# Patient Record
Sex: Female | Born: 1940 | Race: White | Hispanic: No | Marital: Married | Smoking: Former smoker
Health system: Southern US, Community
[De-identification: ages and names within clinical notes are randomized; demographics above are authoritative.]

## PROBLEM LIST (undated history)

## (undated) DIAGNOSIS — C801 Malignant (primary) neoplasm, unspecified: Secondary | ICD-10-CM

## (undated) DIAGNOSIS — I10 Essential (primary) hypertension: Secondary | ICD-10-CM

## (undated) DIAGNOSIS — E7800 Pure hypercholesterolemia, unspecified: Secondary | ICD-10-CM

## (undated) HISTORY — PX: CATARACT EXTRACTION: SUR2

## (undated) HISTORY — DX: Pure hypercholesterolemia, unspecified: E78.00

## (undated) HISTORY — PX: BREAST SURGERY: SHX581

## (undated) HISTORY — DX: Essential (primary) hypertension: I10

---

## 1980-05-12 HISTORY — PX: CHOLECYSTECTOMY: SHX55

## 2016-06-09 ENCOUNTER — Ambulatory Visit (HOSPITAL_COMMUNITY): Payer: Self-pay

## 2016-06-12 ENCOUNTER — Encounter (HOSPITAL_COMMUNITY): Payer: Self-pay

## 2016-06-12 ENCOUNTER — Encounter (HOSPITAL_COMMUNITY): Payer: Medicare Other | Attending: Oncology | Admitting: Oncology

## 2016-06-12 ENCOUNTER — Encounter (HOSPITAL_COMMUNITY): Payer: Self-pay | Admitting: Lab

## 2016-06-12 ENCOUNTER — Telehealth (HOSPITAL_COMMUNITY): Payer: Self-pay | Admitting: Emergency Medicine

## 2016-06-12 ENCOUNTER — Encounter (HOSPITAL_COMMUNITY): Payer: Medicare Other

## 2016-06-12 VITALS — BP 101/73 | HR 72 | Temp 97.7°F | Resp 18 | Ht 63.0 in | Wt 191.8 lb

## 2016-06-12 DIAGNOSIS — E7800 Pure hypercholesterolemia, unspecified: Secondary | ICD-10-CM | POA: Diagnosis not present

## 2016-06-12 DIAGNOSIS — I10 Essential (primary) hypertension: Secondary | ICD-10-CM | POA: Diagnosis not present

## 2016-06-12 DIAGNOSIS — F17210 Nicotine dependence, cigarettes, uncomplicated: Secondary | ICD-10-CM | POA: Diagnosis not present

## 2016-06-12 DIAGNOSIS — Z9221 Personal history of antineoplastic chemotherapy: Secondary | ICD-10-CM | POA: Insufficient documentation

## 2016-06-12 DIAGNOSIS — D3A8 Other benign neuroendocrine tumors: Secondary | ICD-10-CM | POA: Diagnosis not present

## 2016-06-12 DIAGNOSIS — Z888 Allergy status to other drugs, medicaments and biological substances status: Secondary | ICD-10-CM | POA: Diagnosis not present

## 2016-06-12 DIAGNOSIS — R0602 Shortness of breath: Secondary | ICD-10-CM | POA: Insufficient documentation

## 2016-06-12 DIAGNOSIS — Z820 Family history of epilepsy and other diseases of the nervous system: Secondary | ICD-10-CM | POA: Diagnosis not present

## 2016-06-12 DIAGNOSIS — Z885 Allergy status to narcotic agent status: Secondary | ICD-10-CM | POA: Diagnosis not present

## 2016-06-12 DIAGNOSIS — Z9049 Acquired absence of other specified parts of digestive tract: Secondary | ICD-10-CM | POA: Insufficient documentation

## 2016-06-12 DIAGNOSIS — C3491 Malignant neoplasm of unspecified part of right bronchus or lung: Secondary | ICD-10-CM | POA: Insufficient documentation

## 2016-06-12 DIAGNOSIS — Z8249 Family history of ischemic heart disease and other diseases of the circulatory system: Secondary | ICD-10-CM | POA: Diagnosis not present

## 2016-06-12 DIAGNOSIS — Z9889 Other specified postprocedural states: Secondary | ICD-10-CM | POA: Diagnosis not present

## 2016-06-12 DIAGNOSIS — C7A8 Other malignant neuroendocrine tumors: Secondary | ICD-10-CM

## 2016-06-12 DIAGNOSIS — R51 Headache: Secondary | ICD-10-CM | POA: Insufficient documentation

## 2016-06-12 DIAGNOSIS — Z823 Family history of stroke: Secondary | ICD-10-CM | POA: Insufficient documentation

## 2016-06-12 DIAGNOSIS — I4892 Unspecified atrial flutter: Secondary | ICD-10-CM | POA: Diagnosis not present

## 2016-06-12 DIAGNOSIS — R05 Cough: Secondary | ICD-10-CM | POA: Diagnosis not present

## 2016-06-12 DIAGNOSIS — Z79899 Other long term (current) drug therapy: Secondary | ICD-10-CM | POA: Insufficient documentation

## 2016-06-12 DIAGNOSIS — J910 Malignant pleural effusion: Secondary | ICD-10-CM | POA: Insufficient documentation

## 2016-06-12 MED ORDER — PROCHLORPERAZINE MALEATE 10 MG PO TABS: 10.0000 mg | ORAL_TABLET | Freq: Four times a day (QID) | ORAL | 1 refills | Status: DC | PRN

## 2016-06-12 MED ORDER — HEPARIN SOD (PORK) LOCK FLUSH 100 UNIT/ML IV SOLN: 500.0000 [IU] | Freq: Once | INTRAVENOUS | Status: DC

## 2016-06-12 MED ORDER — ONDANSETRON HCL 8 MG PO TABS: 8.0000 mg | ORAL_TABLET | Freq: Two times a day (BID) | ORAL | 1 refills | Status: AC | PRN

## 2016-06-12 MED ORDER — LIDOCAINE-PRILOCAINE 2.5-2.5 % EX CREA: TOPICAL_CREAM | CUTANEOUS | 3 refills | Status: DC

## 2016-06-12 MED ORDER — LIDOCAINE-PRILOCAINE 2.5-2.5 % EX CREA
TOPICAL_CREAM | CUTANEOUS | 3 refills | Status: AC
Start: 2016-06-12 — End: ?

## 2016-06-12 MED ORDER — PROCHLORPERAZINE MALEATE 10 MG PO TABS: 10.0000 mg | ORAL_TABLET | Freq: Four times a day (QID) | ORAL | 1 refills | Status: AC | PRN

## 2016-06-12 MED ORDER — ONDANSETRON HCL 8 MG PO TABS: 8.0000 mg | ORAL_TABLET | Freq: Two times a day (BID) | ORAL | 1 refills | Status: DC | PRN

## 2016-06-12 MED ORDER — SODIUM CHLORIDE 0.9% FLUSH: 10.0000 mL | INTRAVENOUS | Status: DC | PRN

## 2016-06-12 NOTE — Progress Notes (Unsigned)
Referral sent to Dr Arnoldo Morale for port.  appt 2/20. Patient aware

## 2016-06-12 NOTE — Patient Instructions (Signed)
[place] at [facility] Discharge Instructions  RECOMMENDATIONS MADE BY THE CONSULTANT AND ANY TEST RESULTS WILL BE SENT TO YOUR REFERRING PHYSICIAN.  You were seen today by Dr. Barron Schmid We will refer you to Dr. Arnoldo Morale for port-a-cath placment Follow up in 3-4 weeks in our clinic  Thank you for choosing [place] at [facility] to provide your oncology and hematology care.  To afford each patient quality time with our provider, please arrive at least 15 minutes before your scheduled appointment time.    If you have a lab appointment with the [facility] please come in thru the  Main Entrance and check in at the main information desk  You need to re-schedule your appointment should you arrive 10 or more minutes late.  We strive to give you quality time with our providers, and arriving late affects you and other patients whose appointments are after yours.  Also, if you no show three or more times for appointments you may be dismissed from the clinic at the providers discretion.     Again, thank you for choosing [facility].  Our hope is that these requests will decrease the amount of time that you wait before being seen by our physicians.       _____________________________________________________________  Should you have questions after your visit to [facility], please contact our office at [phone] between the hours of 8:30 a.m. and 4:30 p.m.  Voicemails left after 4:30 p.m. will not be returned until the following business day.  For prescription refill requests, have your pharmacy contact our office.       Resources For Cancer Patients and their Caregivers ? American Cancer Society: Can assist with transportation, wigs, general needs, runs Look Good Feel Better.        718 083 2766 ? Cancer Care: Provides financial assistance, online support groups, medication/co-pay assistance.  1-800-813-HOPE  (579)570-5075) ? [place] Assists [place] cancer patients and their families through emotional , educational and financial support.  431-781-7808 ? [place] DSS Where to apply for food stamps, Medicaid and utility assistance. 514-345-3227 ? [employer]: Transportation to medical appointments. 323-275-5941 ? Social Security Administration: May apply for disability if have a Stage IV cancer. (239)752-1637 984-096-9368 ? [employer], Disability and Transit Services: Assists with nutrition, care and transit needs. [place] Support Programs: '@10RELATIVEDAYS'$ @ > Cancer Support Group  2nd Tuesday of the month 1pm-2pm, Journey Room  > Creative Journey  3rd Tuesday of the month 1130am-1pm, Journey Room  > Look Good Feel Better  1st Wednesday of the month 10am-12 noon, Journey Room (Call [place] to register [phone])

## 2016-06-12 NOTE — Progress Notes (Signed)
[place]  CONSULT NOTE  No care team member to display  CHIEF COMPLAINTS/PURPOSE OF CONSULTATION:    No history exists.    HISTORY OF PRESENTING ILLNESS:  Robin Hernandez 76 y.o. female is here because of referral by Robin Slimmer, MD for small-cell lung cancer for chemotherapy.  As per Dr. Hali Marry, MD note on 06/09/2016: " Robin Hernandez is a pleasant 76 year old female with past medical history of hypertension, hyper lipidemia, osteoporosis presented initially with complaints of generalized fatigue came appropriately worse in the past month. In the ED she was found to have elevated pulse 154 which was subsequently found to be atrial flutter. The chest x-ray was found to have a pleural effusion and upon further investigation with CT add large pleural effusion on the right was found. A thoracentesis was performed during hospital stay which removed 1.8 mL of bloody fluid. 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 A mass was also identified upon CT scan and heme-oncology was consulted and I saw the patient while she was in the hospital. She underwent CT abd/pelvis which did not reveal metastasis and Aaron Edelman MRI was also negative for metastasis. She came to outpatient clinic on Monday for day 1 carboplatin AUC 5 and etoposide 100 mg/m2 day 1. Due to national shortage, she has no been able to receive etoposide day 2 and day 3."    Patient presents today accompanied by her husband. She is here for temporary treatment as Dr. Hali Marry clinic currently has a shortage of her chemotherapy medication.  States her breathing is fine, and states doesn't really need the [employer] Plus oxygen unit but her husband wants her to have it.  States she vomited minimally after treatment. Overall tolerated her chemotherapy very well.  She states she is tired, but  she is still active.    MEDICAL HISTORY:  Past Medical History:    Diagnosis Date  . Hypercholesterolemia   . Hypertension     SURGICAL HISTORY: Past Surgical History:  Procedure Laterality Date  . CATARACT EXTRACTION Bilateral    2011-2012  . CHOLECYSTECTOMY  1982    SOCIAL HISTORY: Social History   Social History  . Marital status: Married    Spouse name: N/A  . Number of children: N/A  . Years of education: N/A   Occupational History  . Not on file.   Social History Main Topics  . Smoking status: Former Smoker    Packs/day: 0.75    Years: 55.00    Types: Cigarettes    Quit date: 05/25/2016  . Smokeless tobacco: Former Systems developer  . Alcohol use No  . Drug use: No  . Sexual activity: Not Currently   Other Topics Concern  . Not on file   Social History Narrative  . No narrative on file   Social Hx: Current everyday smoker. Been smoking for 40 years, 1 pack per day.  Patient is married. Patient lives with spouse. Denies alcohol use. Denies drug use. Patient is retired. Has at least 3 grandchildren and 1 son.   FAMILY HISTORY: Family History  Problem Relation Age of Onset  . Parkinson's disease Mother   . Heart attack Mother   . Breast cancer Sister   . Cancer Brother   . Cancer Maternal Aunt   . Cancer Maternal Uncle   . Stroke Paternal Aunt   . Stroke Paternal Uncle   . Heart attack Paternal Uncle   . CAD  Maternal Grandmother   . Heart attack Maternal Grandmother     ALLERGIES:  has no allergies on file.  MEDICATIONS:  Current Outpatient Prescriptions  Medication Sig Dispense Refill  . diclofenac (VOLTAREN) 75 MG EC tablet Take 75 mg by mouth 2 (two) times daily.    . metoprolol tartrate (LOPRESSOR) 25 MG tablet Take 25 mg by mouth daily.    . prochlorperazine (COMPAZINE) 5 MG tablet Take 5 mg by mouth every 6 (six) hours as needed for nausea or vomiting.    . simvastatin (ZOCOR) 40 MG tablet Take 40 mg by mouth daily.     No current facility-administered medications for this visit.    Facility-Administered  Medications Ordered in Other Visits  Medication Dose Route Frequency Provider Last Rate Last Dose  . heparin lock flush 100 unit/mL  500 Units Intravenous Once Twana First, MD      . sodium chloride flush (NS) 0.9 % injection 10 mL  10 mL Intravenous PRN Twana First, MD        Review of Systems  Constitutional: Positive for malaise/fatigue.  HENT: Negative.   Eyes: Negative.   Respiratory: Negative.   Cardiovascular: Negative.   Gastrointestinal: Positive for abdominal pain (right upper abdomen) and vomiting (minimally after first treatment).  Genitourinary: Negative.   Musculoskeletal: Negative.   Skin: Negative.   Neurological: Negative.   Endo/Heme/Allergies: Negative.   Psychiatric/Behavioral: Negative.   All other systems reviewed and are negative.  14 point ROS was done and is otherwise as detailed above or in HPI   PHYSICAL EXAMINATION: ECOG PERFORMANCE STATUS: 1 - Symptomatic but completely ambulatory  Vitals:   06/12/16 0922  BP: 101/73  Pulse: 72  Resp: 18  Temp: 97.7 F (36.5 C)   Filed Weights   06/12/16 0922  Weight: 191 lb 12.8 oz (87 kg)     Physical Exam  Constitutional: She is oriented to person, place, and time and well-developed, well-nourished, and in no distress.  She was able to get on the physical exam table with help.   HENT:  Head: Normocephalic and atraumatic.  Eyes: Conjunctivae and EOM are normal. Pupils are equal, round, and reactive to light.  Neck: Normal range of motion. Neck supple.  Cardiovascular: Normal rate, regular rhythm and normal heart sounds.   Pulmonary/Chest: Effort normal.  Decreased breath sounds in the right lower lobe of the lung  Abdominal: Soft. Bowel sounds are normal.  Musculoskeletal: Normal range of motion.  Neurological: She is alert and oriented to person, place, and time. Gait normal.  Skin: Skin is warm and dry.  Nursing note and vitals reviewed.    LABORATORY DATA:  I have reviewed the data as  listed No results found for: WBC, HGB, HCT, MCV, PLT CMP  No results found for: NA, K, CL, CO2, GLUCOSE, BUN, CREATININE, CALCIUM, PROT, ALBUMIN, AST, ALT, ALKPHOS, BILITOT, GFRNONAA, GFRAA   RADIOGRAPHIC STUDIES: I have personally reviewed the radiological images as listed and agreed with the findings in the report. No results found.   Study Result  Thoracentesis surgical pleural eff 06/02/16  Impression: Status post ultrasound guided right thoracentesis.     ASSESSMENT & PLAN:  Cancer Staging No matching staging information was found for the patient. No problem-specific Assessment & Plan notes found for this encounter. 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  As per Dr. Hali Marry, MD plan on 06/09/16: "1. Small cell neuroendocrine tumor of lung. The patient's pleural fluid showed small  cell neuroendocrine tumor of the lung with high Ki67. MRI of the brain was negative and CT abdomen was negative. 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 The son wanted to know the type of tumor, treatment, prognosis. I informed them that lung cancer is highly aggressive, most common cancer causing mortality, of 2 types small cell and non-small cell. Of these 2, small cell is more aggressive. Prognosis is about 12 months. The prognosis is with treatment. Our usual treatment status gene walls giving combination of carboplatin and etoposide every 21 days for 4-6 cycles and follow the patient with surveillance scans. The patient and the family verbalized understanding. She tolerated day 1 of cycle 1 pretty well. We have not been able to give day 2 and day 3 because the top side is National a short and we have not received it through the pharmacy." I have discussed patient's case over the phone with Dr. Junius Roads last week. She will be with [place] temporarily until Dr. Junius Roads receives the  chemotherapy medicine in stock.   Patient will complete cycle 1 day 2 and 3 of etopisode Feb 5 and 6, 2018. He is planning for a total of 6 cycles of carbo/etoposide.  Surgery consult for chemo port placement.  RTC on 07/07/16 for follow up.    ORDERS PLACED FOR THIS ENCOUNTER: No orders of the defined types were placed in this encounter.   MEDICATIONS PRESCRIBED THIS ENCOUNTER: Meds ordered this encounter  Medications  . simvastatin (ZOCOR) 40 MG tablet    Sig: Take 40 mg by mouth daily.  . metoprolol tartrate (LOPRESSOR) 25 MG tablet    Sig: Take 25 mg by mouth daily.  . diclofenac (VOLTAREN) 75 MG EC tablet    Sig: Take 75 mg by mouth 2 (two) times daily.  . prochlorperazine (COMPAZINE) 5 MG tablet    Sig: Take 5 mg by mouth every 6 (six) hours as needed for nausea or vomiting.     This document serves as a record of services personally performed by Twana First, MD. It was created on her behalf by Shirlean Mylar, a trained medical scribe. The creation of this record is based on the scribe's personal observations and the provider's statements to them. This document has been checked and approved by the attending provider.  I have reviewed the above documentation for accuracy and completeness and I agree with the above.  This note was electronically signed.    [employer]  06/12/2016 10:08 AM

## 2016-06-12 NOTE — Telephone Encounter (Signed)
Called pt to introduce myself as the navigator and explained a little bit about what I do.  Let the pt know that I was going to call in 2 different nausea medication. Pharamcy verified.  She could use the zofran first if it did not work she could add the compazine.  Also called in emla cream for when she receives the port.  She will have her consultation with Dr Arnoldo Morale 07/01/2016 for a port-a-cath.

## 2016-06-12 NOTE — Progress Notes (Signed)
Patient had a regular peripheral IV, not a PICC line. This was removed, see documentation in flowsheet.

## 2016-06-16 ENCOUNTER — Other Ambulatory Visit (HOSPITAL_COMMUNITY): Payer: Self-pay | Admitting: Oncology

## 2016-06-16 ENCOUNTER — Ambulatory Visit (HOSPITAL_COMMUNITY)
Admission: RE | Admit: 2016-06-16 | Discharge: 2016-06-16 | Disposition: A | Discharge status: Home / Self Care | Payer: Medicare Other | Source: Ambulatory Visit | Attending: Oncology | Admitting: Oncology

## 2016-06-16 ENCOUNTER — Ambulatory Visit (HOSPITAL_COMMUNITY)
Admission: RE | Admit: 2016-06-16 | Discharge: 2016-06-16 | Disposition: A | Discharge status: Home / Self Care | Payer: Medicare Other | Source: Ambulatory Visit | Attending: Diagnostic Radiology | Admitting: Diagnostic Radiology

## 2016-06-16 ENCOUNTER — Encounter (HOSPITAL_COMMUNITY): Payer: Self-pay

## 2016-06-16 ENCOUNTER — Encounter (HOSPITAL_COMMUNITY): Payer: Medicare Other

## 2016-06-16 VITALS — BP 117/52 | HR 78 | Temp 97.3°F | Resp 18 | Wt 186.8 lb

## 2016-06-16 DIAGNOSIS — C3491 Malignant neoplasm of unspecified part of right bronchus or lung: Secondary | ICD-10-CM

## 2016-06-16 DIAGNOSIS — J90 Pleural effusion, not elsewhere classified: Secondary | ICD-10-CM | POA: Diagnosis not present

## 2016-06-16 LAB — COMPREHENSIVE METABOLIC PANEL
ALBUMIN: 3.4 g/dL — AB (ref 3.5–5.0)
ALK PHOS: 68 U/L (ref 38–126)
ALT: 21 U/L (ref 14–54)
ANION GAP: 8 (ref 5–15)
AST: 22 U/L (ref 15–41)
BILIRUBIN TOTAL: 1.5 mg/dL — AB (ref 0.3–1.2)
BUN: 16 mg/dL (ref 6–20)
CALCIUM: 8.7 mg/dL — AB (ref 8.9–10.3)
CO2: 27 mmol/L (ref 22–32)
Chloride: 105 mmol/L (ref 101–111)
Creatinine, Ser: 0.92 mg/dL (ref 0.44–1.00)
GFR calc Af Amer: >60 mL/min (ref >60)
GFR calc non Af Amer: >60 mL/min (ref >60)
GLUCOSE: 100 mg/dL — AB (ref 65–99)
Potassium: 3.7 mmol/L (ref 3.5–5.1)
Sodium: 140 mmol/L (ref 135–145)
TOTAL PROTEIN: 6.6 g/dL (ref 6.5–8.1)

## 2016-06-16 LAB — CBC WITH DIFFERENTIAL/PLATELET
BASOS PCT: 0 %
Basophils Absolute: 0 10*3/uL (ref 0.0–0.1)
EOS ABS: 0.1 10*3/uL (ref 0.0–0.7)
EOS PCT: 6 %
HEMATOCRIT: 33.4 % — AB (ref 36.0–46.0)
HEMOGLOBIN: 11.2 g/dL — AB (ref 12.0–15.0)
LYMPHS PCT: 78 %
Lymphs Abs: 1.2 10*3/uL (ref 0.7–4.0)
MCH: 32.3 pg (ref 26.0–34.0)
MCHC: 33.5 g/dL (ref 30.0–36.0)
MCV: 96.3 fL (ref 78.0–100.0)
Monocytes Absolute: 0.1 10*3/uL (ref 0.1–1.0)
Monocytes Relative: 7 %
NEUTROS PCT: 9 %
Neutro Abs: 0.1 10*3/uL — ABNORMAL LOW (ref 1.7–7.7)
Platelets: 34 10*3/uL — ABNORMAL LOW (ref 150–400)
RBC: 3.47 MIL/uL — ABNORMAL LOW (ref 3.87–5.11)
RDW: 12.9 % (ref 11.5–15.5)
WBC: 1.5 10*3/uL — ABNORMAL LOW (ref 4.0–10.5)

## 2016-06-16 NOTE — Progress Notes (Signed)
Thoracentesis complete no signs of distress. 1.3 L pleural fluid removed.

## 2016-06-16 NOTE — Patient Instructions (Signed)
No chemo treatment today, all of your blood counts are to low to give you chemotherapy. Call us with any questions or concerns. Anderson Malta (Nurse Navigator) [phone]  Appointment with Dr.Mark Arnoldo Morale (surgeon) for port-a-cath placement/procedure.   Neutropenia Introduction Neutropenia is a condition that occurs when you have a lower-than-normal level of a type of white blood cell (neutrophil) in your body. Neutrophils are made in the spongy center of large bones (bone marrow) and they fight infections. Neutrophils are your body's main defense against bacterial and fungal infections. The fewer neutrophils you have and the longer your body remains without them, the greater your risk of getting a severe infection. What are the causes? This condition can occur if your body uses up or destroys neutrophils faster than your bone marrow can make them. This problem may happen because of:  Bacterial or fungal infection.  Allergic disorders.  Reactions to some medicines.  Autoimmune disease.  An enlarged spleen. This condition can also occur if your bone marrow does not produce enough neutrophils. This problem may be caused by:  Cancer.  Cancer treatments, such as radiation or chemotherapy.  Viral infections.  Medicines, such as phenytoin.  Vitamin B12 deficiency.  Diseases of the bone marrow.  Environmental toxins, such as insecticides.  Follow these instructions at home: Medicines  Take over-the-counter and prescription medicines only as told by your health care provider.  Get a seasonal flu shot (influenza vaccine). Lifestyle  Do not eat unpasteurized foods.Do not eat unwashed raw fruits or vegetables.  Avoid exposure to groups of people or children.  Avoid being around people who are sick.  Avoid being around dirt or dust, such as in construction areas or gardens.  Do not provide direct care for pets. Avoid animal droppings. Do not clean litter boxes and bird  cages. Hygiene   Bathe daily.  Clean the area between the genitals and the anus (perineal area) after you urinate or have a bowel movement. If you are female, wipe from front to back.  Brush your teeth with a soft toothbrush before and after meals.  Do not use a razor that has a blade. Use an electric razor to remove hair.  Wash your hands often. Make sure others who come in contact with you also wash their hands. If soap and water are not available, use hand sanitizer. General instructions  Do not have sex unless your health care provider has approved.  Take actions to avoid cuts and burns. For example:  Be cautious when you use knives. Always cut away from yourself.  Keep knives in protective sheaths or guards when not in use.  Use oven mitts when you cook with a hot stove, oven, or grill.  Stand a safe distance away from open fires.  Avoid people who received a vaccine in the past 30 days if that vaccine contained a live version of the germ (live vaccine). You should not get a live vaccine. Common live vaccines are varicella, measles, mumps, and rubella.  Do not share food utensils.  Do not use tampons, enemas, or rectal suppositories unless your health care provider has approved.  Keep all appointments as told by your health care provider. This is important. Contact a health care provider if:  You have a fever.  You have chills or you start to shake.  You have:  A sore throat.  A warm, red, or tender area on your skin.  A cough.  Frequent or painful urination.  Vaginal discharge or itching.  You  develop:  Sores in your mouth or anus.  Swollen lymph nodes.  Red streaks on the skin.  A rash.  You feel:  Nauseous or you vomit.  Very fatigued.  Short of breath. This information is not intended to replace advice given to you by your health care provider. Make sure you discuss any questions you have with your health care provider. Document Released:  10/18/2001 Document Revised: 10/04/2015 Document Reviewed: 11/08/2014  2017 Elsevier

## 2016-06-16 NOTE — Progress Notes (Signed)
No chemo today. Platelet count and ANC too low. Neutropenic precautions/education provided to patient and husband. Verbalized understanding. Stable and ambulatory when leaving clinic.

## 2016-06-16 NOTE — Procedures (Signed)
PreOperative Dx: SCCA RT lung, RIGHT pleural effusion Postoperative Dx: SCCA RT lung, RIGHT pleural effusion Procedure:   US guided RIGHT thoracentesis Radiologist:  Thornton Papas Anesthesia:  10 ml of 1% lidocaine Specimen:  1.3 L of serosanguinous colored fluid EBL:   < 1 ml Complications: None

## 2016-06-17 ENCOUNTER — Ambulatory Visit (HOSPITAL_COMMUNITY): Payer: Self-pay

## 2016-06-18 ENCOUNTER — Ambulatory Visit (HOSPITAL_COMMUNITY): Payer: No Typology Code available for payment source

## 2016-06-23 ENCOUNTER — Ambulatory Visit (HOSPITAL_COMMUNITY): Payer: Self-pay

## 2016-06-23 ENCOUNTER — Ambulatory Visit (HOSPITAL_COMMUNITY): Payer: Self-pay | Admitting: Oncology

## 2016-06-24 ENCOUNTER — Ambulatory Visit (HOSPITAL_COMMUNITY): Payer: Self-pay

## 2016-06-25 ENCOUNTER — Ambulatory Visit (HOSPITAL_COMMUNITY): Payer: Self-pay

## 2016-06-30 ENCOUNTER — Telehealth (HOSPITAL_COMMUNITY): Payer: Self-pay

## 2016-06-30 ENCOUNTER — Ambulatory Visit (HOSPITAL_COMMUNITY)
Admission: RE | Admit: 2016-06-30 | Discharge: 2016-06-30 | Disposition: A | Discharge status: Home / Self Care | Payer: Medicare Other | Source: Ambulatory Visit | Attending: Oncology | Admitting: Oncology

## 2016-06-30 ENCOUNTER — Ambulatory Visit (HOSPITAL_COMMUNITY)
Admission: RE | Admit: 2016-06-30 | Discharge: 2016-06-30 | Disposition: A | Discharge status: Home / Self Care | Payer: Medicare Other | Source: Ambulatory Visit | Attending: Diagnostic Radiology | Admitting: Diagnostic Radiology

## 2016-06-30 ENCOUNTER — Encounter (HOSPITAL_COMMUNITY): Payer: Self-pay

## 2016-06-30 ENCOUNTER — Other Ambulatory Visit (HOSPITAL_COMMUNITY): Payer: Self-pay | Admitting: Oncology

## 2016-06-30 DIAGNOSIS — Z9889 Other specified postprocedural states: Secondary | ICD-10-CM | POA: Diagnosis not present

## 2016-06-30 DIAGNOSIS — J90 Pleural effusion, not elsewhere classified: Secondary | ICD-10-CM | POA: Insufficient documentation

## 2016-06-30 DIAGNOSIS — C3491 Malignant neoplasm of unspecified part of right bronchus or lung: Secondary | ICD-10-CM

## 2016-06-30 NOTE — Progress Notes (Signed)
Paracentesis complete no signs of distress. 1.5L serosanguinous fluid removed.

## 2016-06-30 NOTE — Telephone Encounter (Signed)
Patient called stating her husband thinks she needs a thoracentesis. She states her husband is more worried than she is. She states she is having more difficulty breathing this week than last week. She can be reached at [phone].

## 2016-06-30 NOTE — Procedures (Signed)
PreOperative Dx: SCC RT lung, recurrent RT pleural effusion Postoperative Dx: SCC RT lung, recurrent RT pleural effusion Procedure:   US guided RT thoracentesis Radiologist:  Thornton Papas Anesthesia:  13 ml of 1% lidocaine Specimen:  1.5 L of serosanguinous colored fluid EBL:   < 1 ml Complications: None

## 2016-07-01 NOTE — H&P (Addendum)
   Vital Signs:  Vitals as of: 2/82/4175: Systolic 301: Diastolic 63: Heart Rate 040: Temp 96.69F (Temporal): Height 8f 3in: Weight 187Lbs 0 Ounces: BMI 33.13   BMI : 33.13 kg/m2  Subjective: This 76year old female presents for of small cell lung carcinoma.  Referred by Oncology for portacath insertion.  Recently had right thoracentesis.  Has already started chemotherapy.  Review of Symptoms:  Constitutional:fatigue Head:negative Eyes:negative Nose/Mouth/Throat:negative Cardiovascular:negative Respiratory:dyspnea Gastrointestinnegative Genitourinary:frequency joint pain Skin:negative Hematolgic/Lymphatic:negative Allergic/Immunologic:negative   Past Medical History:Reviewed  Past Medical History  Surgical History: cholecystectomy, cataract surgery Medical Problems: small cell lung carcinoma, high cholesterol, tachycardia Allergies: prednisone, codeine, streptomycin Medications: lipitor, diclofenac, metoprolol   Social History:Reviewed  Social History  Preferred Language: English Race:  White Ethnicity: Not Hispanic / Latino Age: [age]year Marital Status:  M Alcohol: no   Smoking Status: Never smoker reviewed on 07/01/2016 Functional Status reviewed on 07/01/2016 ------------------------------------------------ Bathing: Normal Cooking: Normal Dressing: Normal Driving: Normal Eating: Normal Managing Meds: Normal Oral Care: Normal Shopping: Normal Toileting: Normal Transferring: Normal Walking: Normal Cognitive Status reviewed on 07/01/2016 ------------------------------------------------ Attention: Normal Decision Making: Normal Language: Normal Memory: Normal Motor: Normal Perception: Normal Problem Solving: Normal Visual and Spatial: Normal   Family History:Reviewed  Family Health History Mother, Deceased; Parkinson's disease;  Father, Deceased; Healthy;     Objective Information: General:Well appearing, well nourished  in no distress. Head:Atraumatic; no masses; no abnormalities Neck:Supple without lymphadenopathy.  Heart:RRR, no murmur or gallop.  Normal S1, S2.  No S3, S4.  Lungs:CTA bilaterally, no wheezes, rhonchi, rales.  Breathing unlabored. Oncology notes reviewed. Assessment:Small cell lung carcinoma, need for portacath insertion  Diagnoses: 162.9  C34.90 Small cell carcinoma of lung (Malignant neoplasm of unspecified part of unspecified bronchus or lung)  Procedures: [id number]- OFFICE OUTPATIENT NEW 20 MINUTES    Plan:  Scheduled for portacath insertion on 07/04/16.   Patient Education:Alternative treatments to surgery were discussed with patient (and family).Risks and benefits  of procedure including bleeding, infection, and pneumothorax were fully explained to the patient (and family) who gave informed consent. Patient/family questions were addressed.  Follow-up:Pending Surgery

## 2016-07-01 NOTE — Patient Instructions (Signed)
Melrose Kearse  07/01/2016     '@PREFPERIOPPHARMACY'$ @   Your procedure is scheduled on  07/04/2016   Report to [facility] at  900  A.M.  Call this number if you have problems the morning of surgery:  [phone]   Remember:  Do not eat food or drink liquids after midnight.  Take these medicines the morning of surgery with A SIP OF WATER  Metoprolol, zofran or compazine. Take your inhaler before you come.   Do not wear jewelry, make-up or nail polish.  Do not wear lotions, powders, or perfumes, or deoderant.  Do not shave 48 hours prior to surgery.  Men may shave face and neck.  Do not bring valuables to the hospital.  [facility] is not responsible for any belongings or valuables.  Contacts, dentures or bridgework may not be worn into surgery.  Leave your suitcase in the car.  After surgery it may be brought to your room.  For patients admitted to the hospital, discharge time will be determined by your treatment team.  Patients discharged the day of surgery will not be allowed to drive home.   Name and phone number of your driver:   family Special instructions:  None  Please read over the following fact sheets that you were given. Anesthesia Post-op Instructions and Care and Recovery After Surgery       Implanted [facility] Guide An implanted port is a type of central line that is placed under the skin. Central lines are used to provide IV access when treatment or nutrition needs to be given through a person's veins. Implanted ports are used for long-term IV access. An implanted port may be placed because:   You need IV medicine that would be irritating to the small veins in your hands or arms.   You need long-term IV medicines, such as antibiotics.   You need IV nutrition for a long period.   You need frequent blood draws for lab tests.   You need dialysis.  Implanted ports are usually placed in the chest area, but they can also be placed in the  upper arm, the abdomen, or the leg. An implanted port has two main parts:   Reservoir. The reservoir is round and will appear as a small, raised area under your skin. The reservoir is the part where a needle is inserted to give medicines or draw blood.   Catheter. The catheter is a thin, flexible tube that extends from the reservoir. The catheter is placed into a large vein. Medicine that is inserted into the reservoir goes into the catheter and then into the vein.  HOW WILL I CARE FOR MY INCISION SITE? Do not get the incision site wet. Bathe or shower as directed by your health care provider.  HOW IS MY PORT ACCESSED? Special steps must be taken to access the port:   Before the port is accessed, a numbing cream can be placed on the skin. This helps numb the skin over the port site.   Your health care provider uses a sterile technique to access the port.  Your health care provider must put on a mask and sterile gloves.  The skin over your port is cleaned carefully with an antiseptic and allowed to dry.  The port is gently pinched between sterile gloves, and a needle is inserted into the port.  Only "non-coring" port needles should be used to access the port. Once  the port is accessed, a blood return should be checked. This helps ensure that the port is in the vein and is not clogged.   If your port needs to remain accessed for a constant infusion, a clear (transparent) bandage will be placed over the needle site. The bandage and needle will need to be changed every week, or as directed by your health care provider.   Keep the bandage covering the needle clean and dry. Do not get it wet. Follow your health care provider's instructions on how to take a shower or bath while the port is accessed.   If your port does not need to stay accessed, no bandage is needed over the port.  WHAT IS FLUSHING? Flushing helps keep the port from getting clogged. Follow your health care provider's  instructions on how and when to flush the port. Ports are usually flushed with saline solution or a medicine called heparin. The need for flushing will depend on how the port is used.   If the port is used for intermittent medicines or blood draws, the port will need to be flushed:   After medicines have been given.   After blood has been drawn.   As part of routine maintenance.   If a constant infusion is running, the port may not need to be flushed.  HOW LONG WILL MY PORT STAY IMPLANTED? The port can stay in for as long as your health care provider thinks it is needed. When it is time for the port to come out, surgery will be done to remove it. The procedure is similar to the one performed when the port was put in.  WHEN SHOULD I SEEK IMMEDIATE MEDICAL CARE? When you have an implanted port, you should seek immediate medical care if:   You notice a bad smell coming from the incision site.   You have swelling, redness, or drainage at the incision site.   You have more swelling or pain at the port site or the surrounding area.   You have a fever that is not controlled with medicine. This information is not intended to replace advice given to you by your health care provider. Make sure you discuss any questions you have with your health care provider. Document Released: 04/28/2005 Document Revised: 02/16/2013 Document Reviewed: 01/03/2013 Elsevier Interactive Patient Education  2017 [employer] Insertion An implanted port is a central line that has a round shape and is placed under the skin. It is used as a long-term IV access for:   Medicines, such as chemotherapy.   Fluids.   Liquid nutrition, such as total parenteral nutrition (TPN).   Blood samples.  LET [facility] CARE PROVIDER KNOW ABOUT:  Allergies to food or medicine.   Medicines taken, including vitamins, herbs, eye drops, creams, and over-the-counter medicines.   Any allergies to  heparin.  Use of steroids (by mouth or creams).   Previous problems with anesthetics or numbing medicines.   History of bleeding problems or blood clots.   Previous surgery.   Other health problems, including diabetes and kidney problems.   Possibility of pregnancy, if this applies. RISKS AND COMPLICATIONS Generally, this is a safe procedure. However, as with any procedure, problems can occur. Possible problems include:  Damage to the blood vessel, bruising, or bleeding at the puncture site.   Infection.  Blood clot in the vessel that the port is in.  Breakdown of the skin over your port.  Very rarely a person may  develop a condition called a pneumothorax, a collection of air in the chest that may cause one of the lungs to collapse. The placement of these catheters with the appropriate imaging guidance significantly decreases the risk of a pneumothorax.  BEFORE THE PROCEDURE   Your health care provider may want you to have blood tests. These tests can help tell how well your kidneys and liver are working. They can also show how well your blood clots.   If you take blood thinners (anticoagulant medicines), ask your health care provider when you should stop taking them.   Make arrangements for someone to drive you home. This is necessary if you have been sedated for your procedure.  PROCEDURE  Port insertion usually takes about 30-45 minutes.   An IV needle will be inserted in your arm. Medicine for pain and medicine to help relax you (sedative) will flow directly into your body through this needle.   You will lie on an exam table, and you will be connected to monitors to keep track of your heart rate, blood pressure, and breathing throughout the procedure.  An oxygen monitoring device may be attached to your finger. Oxygen will be given.   Everything will be kept as germ free (sterile) as possible during the procedure. The skin near the point of the incision will  be cleansed with antiseptic, and the area will be draped with sterile towels. The skin and deeper tissues over the port area will be made numb with a local anesthetic.  Two small cuts (incisions) will be made in the skin to insert the port. One will be made in the neck to obtain access to the vein where the catheter will lie.   Because the port reservoir will be placed under the skin, a small skin incision will be made in the upper chest, and a small pocket for the port will be made under the skin. The catheter that will be connected to the port tunnels to a large central vein in the chest. A small, raised area will remain on your body at the site of the reservoir when the procedure is complete.  The port placement will be done under imaging guidance to ensure the proper placement.  The reservoir has a silicone covering that can be punctured with a special needle.   The port will be flushed with normal saline, and blood will be drawn to make sure it is working properly.  There will be nothing remaining outside the skin when the procedure is finished.   Incisions will be held together by stitches, surgical glue, or a special tape. AFTER THE PROCEDURE  You will stay in a recovery area until the anesthesia has worn off. Your blood pressure and pulse will be checked.  A final chest X-ray will be taken to check the placement of the port and to ensure that there is no injury to your lung. This information is not intended to replace advice given to you by your health care provider. Make sure you discuss any questions you have with your health care provider. Document Released: 02/16/2013 Document Revised: 05/19/2014 Document Reviewed: 02/16/2013 Elsevier Interactive Patient Education  2017 [place] Insertion, Care After Refer to this sheet in the next few weeks. These instructions provide you with information on caring for yourself after your procedure. Your health care  provider may also give you more specific instructions. Your treatment has been planned according to current medical practices, but problems sometimes occur. Call your health  care provider if you have any problems or questions after your procedure. WHAT TO EXPECT AFTER THE PROCEDURE After your procedure, it is typical to have the following:   Discomfort at the port insertion site. Ice packs to the area will help.  Bruising on the skin over the port. This will subside in 3-4 days. HOME CARE INSTRUCTIONS  After your port is placed, you will get a manufacturer's information card. The card has information about your port. Keep this card with you at all times.   Know what kind of port you have. There are many types of ports available.   Wear a medical alert bracelet in case of an emergency. This can help alert health care workers that you have a port.   The port can stay in for as long as your health care provider believes it is necessary.   A home health care nurse may give medicines and take care of the port.   You or a family member can get special training and directions for giving medicine and taking care of the port at home.  SEEK MEDICAL CARE IF:   Your port does not flush or you are unable to get a blood return.   You have a fever or chills. SEEK IMMEDIATE MEDICAL CARE IF:  You have new fluid or pus coming from your incision.   You notice a bad smell coming from your incision site.   You have swelling, pain, or more redness at the incision or port site.   You have chest pain or shortness of breath. This information is not intended to replace advice given to you by your health care provider. Make sure you discuss any questions you have with your health care provider. Document Released: 02/16/2013 Document Revised: 05/03/2013 Document Reviewed: 02/16/2013 Elsevier Interactive Patient Education  2017 [employer] PATIENT INSTRUCTIONS POST-ANESTHESIA  IMMEDIATELY  FOLLOWING SURGERY:  Do not drive or operate machinery for the first twenty four hours after surgery.  Do not make any important decisions for twenty four hours after surgery or while taking narcotic pain medications or sedatives.  If you develop intractable nausea and vomiting or a severe headache please notify your doctor immediately.  FOLLOW-UP:  Please make an appointment with your surgeon as instructed. You do not need to follow up with anesthesia unless specifically instructed to do so.  WOUND CARE INSTRUCTIONS (if applicable):  Keep a dry clean dressing on the anesthesia/puncture wound site if there is drainage.  Once the wound has quit draining you may leave it open to air.  Generally you should leave the bandage intact for twenty four hours unless there is drainage.  If the epidural site drains for more than 36-48 hours please call the anesthesia department.  QUESTIONS?:  Please feel free to call your physician or the hospital operator if you have any questions, and they will be happy to assist you.

## 2016-07-02 ENCOUNTER — Encounter (HOSPITAL_COMMUNITY): Payer: Self-pay

## 2016-07-02 ENCOUNTER — Encounter (HOSPITAL_COMMUNITY)
Admission: RE | Admit: 2016-07-02 | Discharge: 2016-07-02 | Disposition: A | Discharge status: Home / Self Care | Payer: Medicare Other | Source: Ambulatory Visit | Attending: General Surgery | Admitting: General Surgery

## 2016-07-04 ENCOUNTER — Ambulatory Visit (HOSPITAL_COMMUNITY): Payer: Medicare Other | Admitting: Anesthesiology

## 2016-07-04 ENCOUNTER — Ambulatory Visit (HOSPITAL_COMMUNITY): Payer: Medicare Other

## 2016-07-04 ENCOUNTER — Encounter (HOSPITAL_COMMUNITY)
Admission: RE | Disposition: A | Discharge status: Home / Self Care | Payer: Self-pay | Source: Ambulatory Visit | Attending: General Surgery

## 2016-07-04 ENCOUNTER — Encounter (HOSPITAL_COMMUNITY): Payer: Self-pay | Admitting: *Deleted

## 2016-07-04 ENCOUNTER — Other Ambulatory Visit: Payer: Self-pay

## 2016-07-04 ENCOUNTER — Ambulatory Visit (HOSPITAL_COMMUNITY)
Admission: RE | Admit: 2016-07-04 | Discharge: 2016-07-04 | Disposition: A | Discharge status: Home / Self Care | Payer: Medicare Other | Source: Ambulatory Visit | Attending: General Surgery | Admitting: General Surgery

## 2016-07-04 DIAGNOSIS — C3492 Malignant neoplasm of unspecified part of left bronchus or lung: Secondary | ICD-10-CM | POA: Insufficient documentation

## 2016-07-04 DIAGNOSIS — J90 Pleural effusion, not elsewhere classified: Secondary | ICD-10-CM

## 2016-07-04 DIAGNOSIS — R0602 Shortness of breath: Secondary | ICD-10-CM | POA: Insufficient documentation

## 2016-07-04 DIAGNOSIS — E7800 Pure hypercholesterolemia, unspecified: Secondary | ICD-10-CM | POA: Insufficient documentation

## 2016-07-04 DIAGNOSIS — Z9221 Personal history of antineoplastic chemotherapy: Secondary | ICD-10-CM | POA: Insufficient documentation

## 2016-07-04 DIAGNOSIS — J910 Malignant pleural effusion: Secondary | ICD-10-CM

## 2016-07-04 DIAGNOSIS — I10 Essential (primary) hypertension: Secondary | ICD-10-CM | POA: Insufficient documentation

## 2016-07-04 DIAGNOSIS — Z9981 Dependence on supplemental oxygen: Secondary | ICD-10-CM

## 2016-07-04 DIAGNOSIS — Z888 Allergy status to other drugs, medicaments and biological substances status: Secondary | ICD-10-CM | POA: Insufficient documentation

## 2016-07-04 DIAGNOSIS — Z881 Allergy status to other antibiotic agents status: Secondary | ICD-10-CM | POA: Insufficient documentation

## 2016-07-04 DIAGNOSIS — Z95828 Presence of other vascular implants and grafts: Secondary | ICD-10-CM

## 2016-07-04 DIAGNOSIS — Z87891 Personal history of nicotine dependence: Secondary | ICD-10-CM | POA: Insufficient documentation

## 2016-07-04 DIAGNOSIS — Z885 Allergy status to narcotic agent status: Secondary | ICD-10-CM | POA: Insufficient documentation

## 2016-07-04 HISTORY — PX: PORTACATH PLACEMENT: SHX2246

## 2016-07-04 LAB — CBC WITH DIFFERENTIAL/PLATELET
BASOS PCT: 1 %
Basophils Absolute: 0 10*3/uL (ref 0.0–0.1)
EOS ABS: 0.1 10*3/uL (ref 0.0–0.7)
EOS PCT: 1 %
HCT: 33 % — ABNORMAL LOW (ref 36.0–46.0)
HEMOGLOBIN: 10.9 g/dL — AB (ref 12.0–15.0)
LYMPHS ABS: 1.1 10*3/uL (ref 0.7–4.0)
Lymphocytes Relative: 23 %
MCH: 32.8 pg (ref 26.0–34.0)
MCHC: 33 g/dL (ref 30.0–36.0)
MCV: 99.4 fL (ref 78.0–100.0)
MONOS PCT: 13 %
Monocytes Absolute: 0.6 10*3/uL (ref 0.1–1.0)
NEUTROS PCT: 62 %
Neutro Abs: 3.1 10*3/uL (ref 1.7–7.7)
PLATELETS: 335 10*3/uL (ref 150–400)
RBC: 3.32 MIL/uL — ABNORMAL LOW (ref 3.87–5.11)
RDW: 17 % — AB (ref 11.5–15.5)
WBC: 5 10*3/uL (ref 4.0–10.5)

## 2016-07-04 SURGERY — INSERTION, TUNNELED CENTRAL VENOUS DEVICE, WITH PORT
Anesthesia: Monitor Anesthesia Care | Laterality: Right

## 2016-07-04 MED ORDER — PROPOFOL 500 MG/50ML IV EMUL
INTRAVENOUS | Status: DC | PRN
Start: 2016-07-04 — End: 2016-07-04
  Administered 2016-07-04: 50 ug/kg/min via INTRAVENOUS

## 2016-07-04 MED ORDER — CHLORHEXIDINE GLUCONATE CLOTH 2 % EX PADS: 6.0000 | MEDICATED_PAD | Freq: Once | CUTANEOUS | Status: DC

## 2016-07-04 MED ORDER — LIDOCAINE HCL (PF) 1 % IJ SOLN
INTRAMUSCULAR | Status: AC
  Filled 2016-07-04: qty 5

## 2016-07-04 MED ORDER — LIDOCAINE HCL (PF) 1 % IJ SOLN
INTRAMUSCULAR | Status: DC | PRN
  Administered 2016-07-04: 10 mL

## 2016-07-04 MED ORDER — LIDOCAINE HCL (PF) 1 % IJ SOLN
INTRAMUSCULAR | Status: AC
  Filled 2016-07-04: qty 30

## 2016-07-04 MED ORDER — MIDAZOLAM HCL 2 MG/2ML IJ SOLN
INTRAMUSCULAR | Status: AC
  Filled 2016-07-04: qty 2

## 2016-07-04 MED ORDER — LACTATED RINGERS IV SOLN
INTRAVENOUS | Status: DC
  Administered 2016-07-04: 10:00:00 via INTRAVENOUS

## 2016-07-04 MED ORDER — PROPOFOL 10 MG/ML IV BOLUS
INTRAVENOUS | Status: AC
  Filled 2016-07-04: qty 20

## 2016-07-04 MED ORDER — HEPARIN SOD (PORK) LOCK FLUSH 100 UNIT/ML IV SOLN
INTRAVENOUS | Status: AC
  Filled 2016-07-04: qty 5

## 2016-07-04 MED ORDER — SODIUM CHLORIDE 0.9 % IV SOLN
INTRAVENOUS | Status: DC | PRN
  Administered 2016-07-04: 50 mL via INTRAMUSCULAR

## 2016-07-04 MED ORDER — HEPARIN SOD (PORK) LOCK FLUSH 100 UNIT/ML IV SOLN
INTRAVENOUS | Status: DC | PRN
  Administered 2016-07-04: 500 [IU] via INTRAVENOUS

## 2016-07-04 MED ORDER — MIDAZOLAM HCL 5 MG/5ML IJ SOLN
INTRAMUSCULAR | Status: DC | PRN
  Administered 2016-07-04 (×2): 1 mg via INTRAVENOUS

## 2016-07-04 MED ORDER — CEFAZOLIN SODIUM-DEXTROSE 2-4 GM/100ML-% IV SOLN
2.0000 g | INTRAVENOUS | Status: AC
  Administered 2016-07-04: 2 g via INTRAVENOUS
  Filled 2016-07-04: qty 100

## 2016-07-04 MED ORDER — KETOROLAC TROMETHAMINE 30 MG/ML IJ SOLN
INTRAMUSCULAR | Status: AC
  Filled 2016-07-04: qty 1

## 2016-07-04 MED ORDER — MIDAZOLAM HCL 2 MG/2ML IJ SOLN
1.0000 mg | INTRAMUSCULAR | Status: AC
  Administered 2016-07-04: 2 mg via INTRAVENOUS

## 2016-07-04 MED ORDER — KETOROLAC TROMETHAMINE 30 MG/ML IJ SOLN
30.0000 mg | Freq: Once | INTRAMUSCULAR | Status: AC
  Administered 2016-07-04: 30 mg via INTRAVENOUS

## 2016-07-04 MED ORDER — FENTANYL CITRATE (PF) 100 MCG/2ML IJ SOLN
25.0000 ug | Freq: Once | INTRAMUSCULAR | Status: AC
  Administered 2016-07-04: 25 ug via INTRAVENOUS

## 2016-07-04 MED ORDER — FENTANYL CITRATE (PF) 100 MCG/2ML IJ SOLN
INTRAMUSCULAR | Status: AC
  Filled 2016-07-04: qty 2

## 2016-07-04 SURGICAL SUPPLY — 35 items
APPLIER CLIP 9.375 SM OPEN (CLIP)
BAG DECANTER FOR FLEXI CONT (MISCELLANEOUS) ×3 IMPLANT
BAG HAMPER (MISCELLANEOUS) ×3 IMPLANT
CATH HICKMAN DUAL 12.0 (CATHETERS) IMPLANT
CHLORAPREP W/TINT 10.5 ML (MISCELLANEOUS) ×3 IMPLANT
CLIP APPLIE 9.375 SM OPEN (CLIP) IMPLANT
CLOTH BEACON ORANGE TIMEOUT ST (SAFETY) ×3 IMPLANT
COVER LIGHT HANDLE STERIS (MISCELLANEOUS) ×6 IMPLANT
DECANTER SPIKE VIAL GLASS SM (MISCELLANEOUS) ×3 IMPLANT
DERMABOND ADVANCED (GAUZE/BANDAGES/DRESSINGS) ×2
DERMABOND ADVANCED .7 DNX12 (GAUZE/BANDAGES/DRESSINGS) ×1 IMPLANT
DRAPE C-ARM FOLDED MOBILE STRL (DRAPES) ×3 IMPLANT
ELECT REM PT RETURN 9FT ADLT (ELECTROSURGICAL) ×3
ELECTRODE REM PT RTRN 9FT ADLT (ELECTROSURGICAL) ×1 IMPLANT
GLOVE BIOGEL PI IND STRL 7.0 (GLOVE) ×1 IMPLANT
GLOVE BIOGEL PI INDICATOR 7.0 (GLOVE) ×2
GLOVE SURG SS PI 7.5 STRL IVOR (GLOVE) ×3 IMPLANT
GOWN STRL REUS W/TWL LRG LVL3 (GOWN DISPOSABLE) ×6 IMPLANT
IV NS 500ML (IV SOLUTION) ×2
IV NS 500ML BAXH (IV SOLUTION) ×1 IMPLANT
KIT PORT POWER 8FR ISP MRI (Port) ×3 IMPLANT
KIT ROOM TURNOVER APOR (KITS) ×3 IMPLANT
MANIFOLD NEPTUNE II (INSTRUMENTS) ×3 IMPLANT
NEEDLE HYPO 25X1 1.5 SAFETY (NEEDLE) ×3 IMPLANT
PACK MINOR (CUSTOM PROCEDURE TRAY) ×3 IMPLANT
PAD ARMBOARD 7.5X6 YLW CONV (MISCELLANEOUS) ×3 IMPLANT
SET BASIN LINEN APH (SET/KITS/TRAYS/PACK) ×3 IMPLANT
SET INTRODUCER 12FR PACEMAKER (SHEATH) IMPLANT
SHEATH COOK PEEL AWAY SET 8F (SHEATH) IMPLANT
SUT PROLENE 3 0 PS 2 (SUTURE) IMPLANT
SUT VIC AB 3-0 SH 27 (SUTURE) ×2
SUT VIC AB 3-0 SH 27X BRD (SUTURE) ×1 IMPLANT
SUT VIC AB 4-0 PS2 27 (SUTURE) ×3 IMPLANT
SYR 20CC LL (SYRINGE) ×3 IMPLANT
SYR CONTROL 10ML LL (SYRINGE) ×3 IMPLANT

## 2016-07-04 NOTE — Discharge Instructions (Signed)
Implanted Port Insertion, Care After Refer to this sheet in the next few weeks. These instructions provide you with information on caring for yourself after your procedure. Your health care provider may also give you more specific instructions. Your treatment has been planned according to current medical practices, but problems sometimes occur. Call your health care provider if you have any problems or questions after your procedure. WHAT TO EXPECT AFTER THE PROCEDURE After your procedure, it is typical to have the following:   Discomfort at the port insertion site. Ice packs to the area will help.  Bruising on the skin over the port. This will subside in 3-4 days. HOME CARE INSTRUCTIONS  After your port is placed, you will get a manufacturer's information card. The card has information about your port. Keep this card with you at all times.   Know what kind of port you have. There are many types of ports available.   Wear a medical alert bracelet in case of an emergency. This can help alert health care workers that you have a port.   The port can stay in for as long as your health care provider believes it is necessary.   A home health care nurse may give medicines and take care of the port.   You or a family member can get special training and directions for giving medicine and taking care of the port at home.  SEEK MEDICAL CARE IF:   Your port does not flush or you are unable to get a blood return.   You have a fever or chills. SEEK IMMEDIATE MEDICAL CARE IF:  You have new fluid or pus coming from your incision.   You notice a bad smell coming from your incision site.   You have swelling, pain, or more redness at the incision or port site.   You have chest pain or shortness of breath. This information is not intended to replace advice given to you by your health care provider. Make sure you discuss any questions you have with your health care provider. Document  Released: 02/16/2013 Document Revised: 05/03/2013 Document Reviewed: 02/16/2013 Elsevier Interactive Patient Education  2017 [place] An implanted port is a type of central line that is placed under the skin. Central lines are used to provide IV access when treatment or nutrition needs to be given through a person's veins. Implanted ports are used for long-term IV access. An implanted port may be placed because:   You need IV medicine that would be irritating to the small veins in your hands or arms.   You need long-term IV medicines, such as antibiotics.   You need IV nutrition for a long period.   You need frequent blood draws for lab tests.   You need dialysis.  Implanted ports are usually placed in the chest area, but they can also be placed in the upper arm, the abdomen, or the leg. An implanted port has two main parts:   Reservoir. The reservoir is round and will appear as a small, raised area under your skin. The reservoir is the part where a needle is inserted to give medicines or draw blood.   Catheter. The catheter is a thin, flexible tube that extends from the reservoir. The catheter is placed into a large vein. Medicine that is inserted into the reservoir goes into the catheter and then into the vein.  HOW WILL I CARE FOR MY INCISION SITE? Do not get the incision site wet.  Bathe or shower as directed by your health care provider.  HOW IS MY PORT ACCESSED? Special steps must be taken to access the port:   Before the port is accessed, a numbing cream can be placed on the skin. This helps numb the skin over the port site.   Your health care provider uses a sterile technique to access the port.  Your health care provider must put on a mask and sterile gloves.  The skin over your port is cleaned carefully with an antiseptic and allowed to dry.  The port is gently pinched between sterile gloves, and a needle is inserted into the  port.  Only "non-coring" port needles should be used to access the port. Once the port is accessed, a blood return should be checked. This helps ensure that the port is in the vein and is not clogged.   If your port needs to remain accessed for a constant infusion, a clear (transparent) bandage will be placed over the needle site. The bandage and needle will need to be changed every week, or as directed by your health care provider.   Keep the bandage covering the needle clean and dry. Do not get it wet. Follow your health care provider's instructions on how to take a shower or bath while the port is accessed.   If your port does not need to stay accessed, no bandage is needed over the port.  WHAT IS FLUSHING? Flushing helps keep the port from getting clogged. Follow your health care provider's instructions on how and when to flush the port. Ports are usually flushed with saline solution or a medicine called heparin. The need for flushing will depend on how the port is used.   If the port is used for intermittent medicines or blood draws, the port will need to be flushed:   After medicines have been given.   After blood has been drawn.   As part of routine maintenance.   If a constant infusion is running, the port may not need to be flushed.  HOW LONG WILL MY PORT STAY IMPLANTED? The port can stay in for as long as your health care provider thinks it is needed. When it is time for the port to come out, surgery will be done to remove it. The procedure is similar to the one performed when the port was put in.  WHEN SHOULD I SEEK IMMEDIATE MEDICAL CARE? When you have an implanted port, you should seek immediate medical care if:   You notice a bad smell coming from the incision site.   You have swelling, redness, or drainage at the incision site.   You have more swelling or pain at the port site or the surrounding area.   You have a fever that is not controlled with  medicine. This information is not intended to replace advice given to you by your health care provider. Make sure you discuss any questions you have with your health care provider. Document Released: 04/28/2005 Document Revised: 02/16/2013 Document Reviewed: 01/03/2013 Elsevier Interactive Patient Education  2017 [employer].

## 2016-07-04 NOTE — Anesthesia Postprocedure Evaluation (Signed)
Anesthesia Post Note  Patient: Robin Hernandez  Procedure(s) Performed: Procedure(s) (LRB): INSERTION PORT-A-CATH (Right)  Patient location during evaluation: PACU Anesthesia Type: MAC Level of consciousness: awake and alert and oriented Pain management: pain level controlled Vital Signs Assessment: post-procedure vital signs reviewed and stable Respiratory status: spontaneous breathing Cardiovascular status: stable Postop Assessment: no signs of nausea or vomiting Anesthetic complications: no     Last Vitals:  Vitals:   07/04/16 1045 07/04/16 1141  BP: (!) 101/59 112/64  Pulse:  92  Resp: (!) 26 (!) 25  Temp:  36.6 C    Last Pain:  Vitals:   07/04/16 1141  TempSrc:   PainSc: 1                  Vasili Fok A

## 2016-07-04 NOTE — Anesthesia Procedure Notes (Signed)
Procedure Name: MAC Date/Time: 07/04/2016 10:55 AM Performed by: Andree Elk, AMY A Pre-anesthesia Checklist: Patient identified, Timeout performed, Emergency Drugs available, Suction available and Patient being monitored Oxygen Delivery Method: Simple face mask

## 2016-07-04 NOTE — Interval H&P Note (Signed)
History and Physical Interval Note:  07/04/2016 10:50 AM  Robin Hernandez  has presented today for surgery, with the diagnosis of left lung cancer  The various methods of treatment have been discussed with the patient and family. After consideration of risks, benefits and other options for treatment, the patient has consented to  Procedure(s): INSERTION PORT-A-CATH (Right) as a surgical intervention .  The patient's history has been reviewed, patient examined, no change in status, stable for surgery.  I have reviewed the patient's chart and labs.  Questions were answered to the patient's satisfaction.     Aviva Signs A

## 2016-07-04 NOTE — Op Note (Signed)
Patient:  Robin Hernandez  DOB:  [birth year]  MRN:  [id number]   Preop Diagnosis:  Small cell Lung carcinoma, need for central venous access  Postop Diagnosis:  Same  Procedure:  Port-A-Cath insertion  Surgeon:  Aviva Signs, M.D.  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 She now presents for Port-A-Cath insertion. The risks and benefits of the procedure including bleeding, infection, and the possibility of a pneumothorax were fully explained to the patient, who gave informed consent.  Procedure note:  The patient was placed in the Trendelenburg position after the right upper chest was prepped and draped using the usual sterile technique with DuraPrep. Surgical site confirmation was performed. One percent Xylocaine was used for local anesthesia.  An incision was made below the right clavicle. A subcutaneous pocket was formed. The needles advanced into the right subclavian vein using the Seldinger technique without difficulty. A guidewire was then advanced into the right atrium under fluoroscopic guidance. An introducer and peel-away sheath were placed over the guidewire. The catheter was then inserted through the peel-away sheath and the peel-away sheath was removed. The catheter was then attached to the port and the port placed in subcutaneous pocket. Adequate positioning was confirmed by fluoroscopy. Good backflow blood was noted on aspiration of the port. The port was flushed with heparin flush. The subcutaneous layer was reapproximated using a 3-0 Vicryl interrupted suture. The skin was closed using a 4-0 Vicryl subcuticular suture. Dermabond was applied.  All tape and needle counts were correct at the end of the procedure. The patient was awakened and transferred to PACU in stable condition. A chest x-ray will be performed at that time.    Complications:  None  EBL:  Minimal  Specimen:   None

## 2016-07-04 NOTE — Transfer of Care (Signed)
Immediate Anesthesia Transfer of Care Note  Patient: Robin Hernandez  Procedure(s) Performed: Procedure(s): INSERTION PORT-A-CATH (Right)  Patient Location: PACU  Anesthesia Type:MAC  Level of Consciousness: awake, alert , oriented and patient cooperative  Airway & Oxygen Therapy: Patient Spontanous Breathing and Patient connected to nasal cannula oxygen  Post-op Assessment: Report given to RN and Post -op Vital signs reviewed and stable  Post vital signs: Reviewed and stable  Last Vitals:  Vitals:   07/04/16 1035 07/04/16 1040  BP:  103/63  Pulse:    Resp: (!) 32 (!) 34  Temp:      Last Pain:  Vitals:   07/04/16 0901  TempSrc: Oral  PainSc: 7       Patients Stated Pain Goal: 8 ([id number])  Complications: No apparent anesthesia complications

## 2016-07-04 NOTE — Anesthesia Preprocedure Evaluation (Signed)
Anesthesia Evaluation  Patient identified by MRN, date of birth, ID band Patient awake    Reviewed: Allergy & Precautions, NPO status , Patient's Chart, lab work & pertinent test results, reviewed documented beta blocker date and time   Airway Mallampati: II  TM Distance: >3 FB     Dental  (+) Edentulous Upper, Partial Lower   Pulmonary shortness of breath, with exertion, at rest, lying and Long-Term Oxygen Therapy, former smoker,  Lung Cancer    breath sounds clear to auscultation       Cardiovascular hypertension, Pt. on home beta blockers and Pt. on medications  Rhythm:Regular Rate:Normal     Neuro/Psych    GI/Hepatic negative GI ROS,   Endo/Other    Renal/GU      Musculoskeletal   Abdominal   Peds  Hematology   Anesthesia Other Findings   Reproductive/Obstetrics                             Anesthesia Physical Anesthesia Plan  ASA: IV  Anesthesia Plan: MAC   Post-op Pain Management:    Induction: Intravenous  Airway Management Planned: Simple Face Mask  Additional Equipment:   Intra-op Plan:   Post-operative Plan:   Informed Consent: I have reviewed the patients History and Physical, chart, labs and discussed the procedure including the risks, benefits and alternatives for the proposed anesthesia with the patient or authorized representative who has indicated his/her understanding and acceptance.     Plan Discussed with:   Anesthesia Plan Comments:         Anesthesia Quick Evaluation

## 2016-07-05 ENCOUNTER — Other Ambulatory Visit: Payer: Self-pay

## 2016-07-05 ENCOUNTER — Inpatient Hospital Stay (HOSPITAL_COMMUNITY)
Admission: EM | Admit: 2016-07-05 | Discharge: 2016-07-07 | DRG: 181 | Disposition: A | Discharge status: Home Health | Payer: Medicare Other | Attending: Internal Medicine | Admitting: Internal Medicine

## 2016-07-05 ENCOUNTER — Emergency Department (HOSPITAL_COMMUNITY): Payer: Medicare Other

## 2016-07-05 ENCOUNTER — Encounter (HOSPITAL_COMMUNITY): Payer: Self-pay | Admitting: Emergency Medicine

## 2016-07-05 DIAGNOSIS — Z803 Family history of malignant neoplasm of breast: Secondary | ICD-10-CM

## 2016-07-05 DIAGNOSIS — J910 Malignant pleural effusion: Secondary | ICD-10-CM | POA: Diagnosis present

## 2016-07-05 DIAGNOSIS — C3492 Malignant neoplasm of unspecified part of left bronchus or lung: Secondary | ICD-10-CM | POA: Diagnosis present

## 2016-07-05 DIAGNOSIS — Z87891 Personal history of nicotine dependence: Secondary | ICD-10-CM

## 2016-07-05 DIAGNOSIS — Z9221 Personal history of antineoplastic chemotherapy: Secondary | ICD-10-CM

## 2016-07-05 DIAGNOSIS — J9611 Chronic respiratory failure with hypoxia: Secondary | ICD-10-CM | POA: Diagnosis not present

## 2016-07-05 DIAGNOSIS — J9610 Chronic respiratory failure, unspecified whether with hypoxia or hypercapnia: Secondary | ICD-10-CM | POA: Diagnosis present

## 2016-07-05 DIAGNOSIS — Z9841 Cataract extraction status, right eye: Secondary | ICD-10-CM | POA: Diagnosis not present

## 2016-07-05 DIAGNOSIS — Z888 Allergy status to other drugs, medicaments and biological substances status: Secondary | ICD-10-CM | POA: Diagnosis not present

## 2016-07-05 DIAGNOSIS — E785 Hyperlipidemia, unspecified: Secondary | ICD-10-CM | POA: Diagnosis present

## 2016-07-05 DIAGNOSIS — Z885 Allergy status to narcotic agent status: Secondary | ICD-10-CM

## 2016-07-05 DIAGNOSIS — I10 Essential (primary) hypertension: Secondary | ICD-10-CM | POA: Diagnosis present

## 2016-07-05 DIAGNOSIS — C801 Malignant (primary) neoplasm, unspecified: Secondary | ICD-10-CM | POA: Diagnosis not present

## 2016-07-05 DIAGNOSIS — R0600 Dyspnea, unspecified: Secondary | ICD-10-CM | POA: Diagnosis present

## 2016-07-05 DIAGNOSIS — J90 Pleural effusion, not elsewhere classified: Secondary | ICD-10-CM | POA: Diagnosis present

## 2016-07-05 DIAGNOSIS — Z881 Allergy status to other antibiotic agents status: Secondary | ICD-10-CM | POA: Diagnosis not present

## 2016-07-05 DIAGNOSIS — R000 Tachycardia, unspecified: Secondary | ICD-10-CM

## 2016-07-05 DIAGNOSIS — Z9842 Cataract extraction status, left eye: Secondary | ICD-10-CM

## 2016-07-05 DIAGNOSIS — R7989 Other specified abnormal findings of blood chemistry: Secondary | ICD-10-CM

## 2016-07-05 DIAGNOSIS — D649 Anemia, unspecified: Secondary | ICD-10-CM | POA: Diagnosis not present

## 2016-07-05 DIAGNOSIS — Z79899 Other long term (current) drug therapy: Secondary | ICD-10-CM | POA: Diagnosis not present

## 2016-07-05 DIAGNOSIS — Z91048 Other nonmedicinal substance allergy status: Secondary | ICD-10-CM

## 2016-07-05 HISTORY — DX: Malignant (primary) neoplasm, unspecified: C80.1

## 2016-07-05 LAB — D-DIMER, QUANTITATIVE: D-Dimer, Quant: 3.64 ug/mL-FEU — ABNORMAL HIGH (ref 0.00–0.50)

## 2016-07-05 LAB — TROPONIN I

## 2016-07-05 LAB — CBC
HCT: 32.4 % — ABNORMAL LOW (ref 36.0–46.0)
Hemoglobin: 10.8 g/dL — ABNORMAL LOW (ref 12.0–15.0)
MCH: 33.1 pg (ref 26.0–34.0)
MCHC: 33.3 g/dL (ref 30.0–36.0)
MCV: 99.4 fL (ref 78.0–100.0)
PLATELETS: 330 10*3/uL (ref 150–400)
RBC: 3.26 MIL/uL — AB (ref 3.87–5.11)
RDW: 17.2 % — ABNORMAL HIGH (ref 11.5–15.5)
WBC: 5.4 10*3/uL (ref 4.0–10.5)

## 2016-07-05 LAB — BASIC METABOLIC PANEL
Anion gap: 8 (ref 5–15)
BUN: 17 mg/dL (ref 6–20)
CO2: 25 mmol/L (ref 22–32)
CREATININE: 0.88 mg/dL (ref 0.44–1.00)
Calcium: 9.3 mg/dL (ref 8.9–10.3)
Chloride: 103 mmol/L (ref 101–111)
Glucose, Bld: 133 mg/dL — ABNORMAL HIGH (ref 65–99)
Potassium: 3.5 mmol/L (ref 3.5–5.1)
SODIUM: 136 mmol/L (ref 135–145)

## 2016-07-05 LAB — TSH: TSH: 11.449 u[IU]/mL — AB (ref 0.350–4.500)

## 2016-07-05 MED ORDER — ENOXAPARIN SODIUM 40 MG/0.4ML ~~LOC~~ SOLN
40.0000 mg | Freq: Every day | SUBCUTANEOUS | Status: DC
  Administered 2016-07-05: 40 mg via SUBCUTANEOUS
  Filled 2016-07-05: qty 0.4

## 2016-07-05 MED ORDER — ONDANSETRON HCL 4 MG/2ML IJ SOLN
INTRAMUSCULAR | Status: AC
  Administered 2016-07-06: 4 mg via INTRAVENOUS
  Filled 2016-07-05: qty 2

## 2016-07-05 MED ORDER — ACETAMINOPHEN 650 MG RE SUPP: 650.0000 mg | Freq: Four times a day (QID) | RECTAL | Status: DC | PRN

## 2016-07-05 MED ORDER — MORPHINE SULFATE (PF) 2 MG/ML IV SOLN
0.5000 mg | INTRAVENOUS | Status: DC | PRN
  Administered 2016-07-05: 0.5 mg via INTRAVENOUS
  Filled 2016-07-05: qty 1

## 2016-07-05 MED ORDER — METOPROLOL TARTRATE 25 MG PO TABS
25.0000 mg | ORAL_TABLET | Freq: Every day | ORAL | Status: DC
  Administered 2016-07-07: 25 mg via ORAL
  Filled 2016-07-05 (×2): qty 1

## 2016-07-05 MED ORDER — ONDANSETRON HCL 4 MG PO TABS: 8.0000 mg | ORAL_TABLET | Freq: Two times a day (BID) | ORAL | Status: DC | PRN

## 2016-07-05 MED ORDER — MORPHINE SULFATE (PF) 2 MG/ML IV SOLN
2.0000 mg | Freq: Once | INTRAVENOUS | Status: AC
  Administered 2016-07-05: 2 mg via INTRAVENOUS
  Filled 2016-07-05: qty 1

## 2016-07-05 MED ORDER — ONDANSETRON HCL 4 MG/2ML IJ SOLN
4.0000 mg | Freq: Four times a day (QID) | INTRAMUSCULAR | Status: DC | PRN
  Administered 2016-07-05 – 2016-07-06 (×2): 4 mg via INTRAVENOUS
  Filled 2016-07-05: qty 2

## 2016-07-05 MED ORDER — DIPHENHYDRAMINE HCL 25 MG PO CAPS: 25.0000 mg | ORAL_CAPSULE | Freq: Four times a day (QID) | ORAL | Status: DC | PRN

## 2016-07-05 MED ORDER — SIMVASTATIN 40 MG PO TABS
40.0000 mg | ORAL_TABLET | Freq: Every day | ORAL | Status: DC
  Administered 2016-07-07: 40 mg via ORAL
  Filled 2016-07-05 (×3): qty 1

## 2016-07-05 MED ORDER — ALBUTEROL SULFATE (2.5 MG/3ML) 0.083% IN NEBU: 2.5000 mg | INHALATION_SOLUTION | RESPIRATORY_TRACT | Status: DC | PRN

## 2016-07-05 MED ORDER — PROCHLORPERAZINE MALEATE 10 MG PO TABS
10.0000 mg | ORAL_TABLET | Freq: Four times a day (QID) | ORAL | Status: DC | PRN
  Filled 2016-07-05: qty 1

## 2016-07-05 MED ORDER — ACETAMINOPHEN 325 MG PO TABS
650.0000 mg | ORAL_TABLET | Freq: Four times a day (QID) | ORAL | Status: DC | PRN
  Administered 2016-07-05 – 2016-07-06 (×2): 650 mg via ORAL
  Filled 2016-07-05 (×2): qty 2

## 2016-07-05 MED ORDER — MORPHINE SULFATE (PF) 2 MG/ML IV SOLN
2.0000 mg | INTRAVENOUS | Status: DC | PRN
  Administered 2016-07-06 – 2016-07-07 (×3): 2 mg via INTRAVENOUS
  Filled 2016-07-05 (×3): qty 1

## 2016-07-05 NOTE — H&P (Addendum)
TRH H&P   Patient Demographics:    Robin Hernandez, is a 76 y.o. female  MRN: [id number]   DOB - May 01, 1941  Admit Date - 07/05/2016  Outpatient Primary MD for the patient is No PCP Per Patient  Referring MD/NP/PA: Carmin Muskrat  Outpatient Specialists: Elta Guadeloupe Jenkins(surgery), Dr. Talbert Cage (oncology), Dr. Junius Roads (oncology, [place], [place])  Patient coming from:  home  Chief Complaint  Patient presents with  . Chest Pain      HPI:    Robin Hernandez  is a 76 y.o. 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    In ED,  CXR revealed R sided pleural effusion 1/2 up .  ED requested admission for dyspnea, cp, and recurrent R sided pleural effusion.  Pt noted to be tachycardic.     Review of systems:    In addition to the HPI above,  No Fever-chills, No Headache, No changes with Vision or hearing, No problems swallowing food or Liquids, No Cough No Abdominal pain, No Nausea or Vommitting, Bowel movements are regular, No Blood in stool or Urine, No dysuria, No new skin rashes or bruises, No new joints pains-aches,  No new weakness, tingling, numbness in any extremity, No recent weight gain or loss, No polyuria, polydypsia or polyphagia, No significant Mental Stressors.  A full 10 point Review of Systems was done, except as stated above, all other Review of Systems were negative.   With Past History of the following :    Past Medical History:  Diagnosis Date  . Cancer ([place])    lung  . Hypercholesterolemia   . Hypertension       Past Surgical History:  Procedure Laterality Date  . BREAST SURGERY    . CATARACT EXTRACTION Bilateral    2011-2012  . CHOLECYSTECTOMY  1982       Social History:     Social History  Substance Use Topics  . Smoking status: Former Smoker    Packs/day: 0.75    Years: 55.00    Types: Cigarettes    Quit date: 05/25/2016  . Smokeless tobacco: Former Systems developer  . Alcohol use No     Lives - at home with husband  Mobility - able to walk w walker   Family History :     Family History  Problem Relation Age of Onset  . Parkinson's disease Mother   . Heart attack Mother   . Breast cancer Sister   . Cancer Brother   . Cancer Maternal Aunt   . Cancer Maternal Uncle   . Stroke Paternal Aunt   . Stroke Paternal Uncle   . Heart attack Paternal Uncle   . CAD Maternal Grandmother   . Heart  attack Maternal Grandmother       Home Medications:   Prior to Admission medications   Medication Sig Start Date End Date Taking? Authorizing Provider  diclofenac (VOLTAREN) 75 MG EC tablet Take 75 mg by mouth daily.     Historical Provider, MD  lidocaine-prilocaine (EMLA) cream Apply to affected area once 06/12/16   Twana First, MD  metoprolol tartrate (LOPRESSOR) 25 MG tablet Take 25 mg by mouth daily. Takes only once daily    Historical Provider, MD  ondansetron (ZOFRAN) 8 MG tablet Take 1 tablet (8 mg total) by mouth 2 (two) times daily as needed for refractory nausea / vomiting. 06/12/16   Twana First, MD  prochlorperazine (COMPAZINE) 10 MG tablet Take 1 tablet (10 mg total) by mouth every 6 (six) hours as needed (Nausea or vomiting). 06/12/16   Twana First, MD  simvastatin (ZOCOR) 40 MG tablet Take 40 mg by mouth daily at 6 PM.     Historical Provider, MD  VENTOLIN HFA 108 (90 Base) MCG/ACT inhaler Inhale 2 puffs into the lungs every 4 (four) hours as needed for wheezing or shortness of breath.  06/12/16   Historical Provider, MD     Allergies:     Allergies  Allergen Reactions  . Codeine Hives  . Prednisone Other (See Comments)    Altered mental status  . Streptomycin Hives  . Tape Rash     Physical Exam:   Vitals  Blood  pressure 108/63, pulse 101, temperature 97.9 F (36.6 C), temperature source Oral, resp. rate 20, height '5\' 3"'$  (1.6 m), weight 84.8 kg (187 lb), SpO2 99 %.   1. General  lying in bed in NAD,    2. Normal affect and insight, Not Suicidal or Homicidal, Awake Alert, Oriented X 3.  3. No F.N deficits, ALL C.Nerves Intact, Strength 5/5 all 4 extremities, Sensation intact all 4 extremities, Plantars down going.  4. Ears and Eyes appear Normal, Conjunctivae clear, PERRLA. Moist Oral Mucosa.  5. Supple Neck, No JVD, No cervical lymphadenopathy appriciated, No Carotid Bruits.  6. Symmetrical Chest wall movement, Good air movement bilaterally, decrease bs at the right lung base, no wheezing.   7.  Tachycardic s1, s2, , No Gallops, Rubs or Murmurs, No Parasternal Heave.  8. Positive Bowel Sounds, Abdomen Soft, No tenderness, No organomegaly appriciated,No rebound -guarding or rigidity.  9.  No Cyanosis, Normal Skin Turgor, No Skin Rash or Bruise.  10. Good muscle tone,  joints appear normal , no effusions, Normal ROM.  11. No Palpable Lymph Nodes in Neck or Axillae    Data Review:    CBC  Recent Labs Lab 07/04/16 0920 07/05/16 1642  WBC 5.0 5.4  HGB 10.9* 10.8*  HCT 33.0* 32.4*  PLT 335 330  MCV 99.4 99.4  MCH 32.8 33.1  MCHC 33.0 33.3  RDW 17.0* 17.2*  LYMPHSABS 1.1  --   MONOABS 0.6  --   EOSABS 0.1  --   BASOSABS 0.0  --    ------------------------------------------------------------------------------------------------------------------  Chemistries   Recent Labs Lab 07/05/16 1642  NA 136  K 3.5  CL 103  CO2 25  GLUCOSE 133*  BUN 17  CREATININE 0.88  CALCIUM 9.3   ------------------------------------------------------------------------------------------------------------------ estimated creatinine clearance is 58.8 mL/min (by C-G formula based on SCr of 0.88  mg/dL). ------------------------------------------------------------------------------------------------------------------ No results for input(s): TSH, T4TOTAL, T3FREE, THYROIDAB in the last 72 hours.  Invalid input(s): FREET3  Coagulation profile No results for input(s): INR, PROTIME in the last 168 hours. -------------------------------------------------------------------------------------------------------------------  No results for input(s): DDIMER in the last 72 hours. -------------------------------------------------------------------------------------------------------------------  Cardiac Enzymes  Recent Labs Lab 07/05/16 1642  TROPONINI <0.03   ------------------------------------------------------------------------------------------------------------------ No results found for: BNP   ---------------------------------------------------------------------------------------------------------------  Urinalysis No results found for: COLORURINE, APPEARANCEUR, LABSPEC, PHURINE, GLUCOSEU, HGBUR, BILIRUBINUR, KETONESUR, PROTEINUR, UROBILINOGEN, NITRITE, LEUKOCYTESUR  ----------------------------------------------------------------------------------------------------------------   Imaging Results:    Dg Chest 2 View  Result Date: 07/05/2016 CLINICAL DATA:  Chest pain and shortness of breath. History of lung cancer. EXAM: CHEST  2 VIEW COMPARISON:  07/04/2016 FINDINGS: There is a right chest wall port a catheter noted with tip in the projection of the SVC. Aortic atherosclerosis noted. Interval progressive re-accumulation of right pleural fluid since thoracentesis from 2/19/ 18. IMPRESSION: 1. Increase in volume of right pleural effusion with worsening aeration to the right lung. Electronically Signed   By: Kerby Moors M.D.   On: 07/05/2016 17:33   Dg Chest Port 1 View  Result Date: 07/04/2016 CLINICAL DATA:  Port-A-Cath placement. EXAM: PORTABLE CHEST 1 VIEW COMPARISON:   06/30/2016 FINDINGS: Right-sided Port-A-Cath is new in the interval with the tip overlying the proximal SVC region. No evidence for right pneumothorax. Prominent interval progression of right pleural effusion now large in character. Left lung is clear. The cardio pericardial silhouette is enlarged. The visualized bony structures of the thorax are intact. IMPRESSION: No evidence for pneumothorax status post Port-A-Cath placement. Electronically Signed   By: Misty Stanley M.D.   On: 07/04/2016 12:13   Dg C-arm 1-60 Min-no Report  Result Date: 07/04/2016 Fluoroscopy was utilized by the requesting physician.  No radiographic interpretation.       Assessment & Plan:    Active Problems:   Dyspnea   Tachycardia   Anemia    1.  Cp Tele Trop I q6hx3 D dimer , if positive then CTA chest r/o PE  2. Tachycardia Trop I q6h x3 Check tsh Check cardiac echo   3. R pleural effusion (recurrent) IR thoracenteis ordered for diagnostic and therapeutic purposes Consider discuss with  CT surgery/pulmonary  in am if would benefit from pleurex catheter vs surgical intervention  4.  Anemia Check cbc in am  5. Small cell lung cancer Please consult oncology in am to see if can arrange her chemo which was scheduled for 2/26   DVT Prophylaxis Lovenox - SCDs   AM Labs Ordered, also please review Full Orders  Family Communication: Admission, patients condition and plan of care including tests being ordered have been discussed with the patient  who indicate understanding and agree with the plan and Code Status.  Code Status FULL CODE  Likely DC to  home  Condition GUARDED    Consults called:   Admission status: inpatient  Time spent in minutes :  45 minutes   Jani Gravel M.D on 07/05/2016 at 6:39 PM  Between 7am to 7pm - Pager - [phone]. After 7pm go to [email or web address] - password [facility]  [facility] - Office  [phone]

## 2016-07-05 NOTE — ED Triage Notes (Signed)
Pt c/o chest pressure and SOB that began yesterday after she had a port placed. Pt states on Tuesday she had a thoracentesis. Dyspnea at rest.

## 2016-07-05 NOTE — ED Provider Notes (Signed)
[facility] DEPT Provider Note   CSN: [id number] Arrival date & time: 07/05/16  1559     History   Chief Complaint Chief Complaint  Patient presents with  . Chest Pain    HPI Minh Jasper is a 76 y.o. female.  HPI Patient presents with worsening dyspnea. Note, patient was diagnosed with lung cancer little more than one month ago. Since diagnosis she has had multiple chemotherapy sessions, and placement of a right-sided chest port yesterday. She has had thoracentesis 4 times, last one week ago. Today she presents with worsening dyspnea, no fever. Symptoms are worse with supine positioning, activity. She also claims of generalized discomfort, though no other focal pain. There is right-sided chest pain, that is sore, moderate, worse with inspiration. No relief with previous narcotics.  Past Medical History:  Diagnosis Date  . Cancer ([place])    lung  . Hypercholesterolemia   . Hypertension     Patient Active Problem List   Diagnosis Date Noted  . Pleural effusion 06/16/2016  . Small cell carcinoma of lung, right ([place]) 06/12/2016    Past Surgical History:  Procedure Laterality Date  . BREAST SURGERY    . CATARACT EXTRACTION Bilateral    2011-2012  . CHOLECYSTECTOMY  1982    OB History    No data available       Home Medications    Prior to Admission medications   Medication Sig Start Date End Date Taking? Authorizing Provider  diclofenac (VOLTAREN) 75 MG EC tablet Take 75 mg by mouth daily.     Historical Provider, MD  lidocaine-prilocaine (EMLA) cream Apply to affected area once 06/12/16   Twana First, MD  metoprolol tartrate (LOPRESSOR) 25 MG tablet Take 25 mg by mouth daily. Takes only once daily    Historical Provider, MD  ondansetron (ZOFRAN) 8 MG tablet Take 1 tablet (8 mg total) by mouth 2 (two) times daily as needed for refractory nausea / vomiting. 06/12/16   Twana First, MD  prochlorperazine (COMPAZINE) 10 MG tablet Take 1 tablet (10 mg total) by  mouth every 6 (six) hours as needed (Nausea or vomiting). 06/12/16   Twana First, MD  simvastatin (ZOCOR) 40 MG tablet Take 40 mg by mouth daily at 6 PM.     Historical Provider, MD  VENTOLIN HFA 108 (90 Base) MCG/ACT inhaler Inhale 2 puffs into the lungs every 4 (four) hours as needed for wheezing or shortness of breath.  06/12/16   Historical Provider, MD    Family History Family History  Problem Relation Age of Onset  . Parkinson's disease Mother   . Heart attack Mother   . Breast cancer Sister   . Cancer Brother   . Cancer Maternal Aunt   . Cancer Maternal Uncle   . Stroke Paternal Aunt   . Stroke Paternal Uncle   . Heart attack Paternal Uncle   . CAD Maternal Grandmother   . Heart attack Maternal Grandmother     Social History Social History  Substance Use Topics  . Smoking status: Former Smoker    Packs/day: 0.75    Years: 55.00    Types: Cigarettes    Quit date: 05/25/2016  . Smokeless tobacco: Former Systems developer  . Alcohol use No     Allergies   Codeine; Prednisone; Streptomycin; and Tape   Review of Systems Review of Systems  Constitutional:       Per HPI, otherwise negative  HENT:       Per HPI, otherwise negative  Respiratory:       Per HPI, otherwise negative  Cardiovascular:       Per HPI, otherwise negative  Gastrointestinal: Negative for vomiting.  Endocrine:       Negative aside from HPI  Genitourinary:       Neg aside from HPI   Musculoskeletal:       Per HPI, otherwise negative  Skin: Negative.   Allergic/Immunologic: Positive for immunocompromised state.  Neurological: Negative for syncope.     Physical Exam Updated Vital Signs BP (!) 97/43 (BP Location: Left Arm)   Pulse (!) 121   Temp 97.9 F (36.6 C) (Oral)   Resp 22   Ht '5\' 3"'$  (1.6 m)   Wt 187 lb (84.8 kg)   SpO2 90%   BMI 33.13 kg/m   Physical Exam  Constitutional: She is oriented to person, place, and time. She has a sickly appearance. She appears ill.  HENT:  Head:  Normocephalic and atraumatic.  Eyes: Conjunctivae and EOM are normal.  Cardiovascular: Regular rhythm.  Tachycardia present.   Pulmonary/Chest: No stridor. Tachypnea noted. She is in respiratory distress. She has decreased breath sounds in the right upper field, the right middle field and the right lower field.  Abdominal: She exhibits no distension.  Musculoskeletal: She exhibits no edema.  Neurological: She is alert and oriented to person, place, and time. No cranial nerve deficit.  Skin: Skin is warm and dry.  Psychiatric: She has a normal mood and affect.  Nursing note and vitals reviewed.    ED Treatments / Results  Labs (all labs ordered are listed, but only abnormal results are displayed) Labs Reviewed  BASIC METABOLIC PANEL - Abnormal; Notable for the following:       Result Value   Glucose, Bld 133 (*)    All other components within normal limits  CBC - Abnormal; Notable for the following:    RBC 3.26 (*)    Hemoglobin 10.8 (*)    HCT 32.4 (*)    RDW 17.2 (*)    All other components within normal limits  TROPONIN I    EKG  EKG Interpretation  Date/Time:  Saturday July 05 2016 16:04:43 EST Ventricular Rate:  122 PR Interval:  134 QRS Duration: 80 QT Interval:  314 QTC Calculation: 447 R Axis:   50 Text Interpretation:  Sinus tachycardia with occasional Premature ventricular complexes Nonspecific T wave abnormality Artifact Abnormal ekg Confirmed by Carmin Muskrat  MD [phone]) on 07/05/2016 4:27:50 PM       Radiology Dg Chest 2 View  Result Date: 07/05/2016 CLINICAL DATA:  Chest pain and shortness of breath. History of lung cancer. EXAM: CHEST  2 VIEW COMPARISON:  07/04/2016 FINDINGS: There is a right chest wall port a catheter noted with tip in the projection of the SVC. Aortic atherosclerosis noted. Interval progressive re-accumulation of right pleural fluid since thoracentesis from 2/19/ 18. IMPRESSION: 1. Increase in volume of right pleural effusion with  worsening aeration to the right lung. Electronically Signed   By: Kerby Moors M.D.   On: 07/05/2016 17:33   Dg Chest Port 1 View  Result Date: 07/04/2016 CLINICAL DATA:  Port-A-Cath placement. EXAM: PORTABLE CHEST 1 VIEW COMPARISON:  06/30/2016 FINDINGS: Right-sided Port-A-Cath is new in the interval with the tip overlying the proximal SVC region. No evidence for right pneumothorax. Prominent interval progression of right pleural effusion now large in character. Left lung is clear. The cardio pericardial silhouette is enlarged. The visualized bony structures of the  thorax are intact. IMPRESSION: No evidence for pneumothorax status post Port-A-Cath placement. Electronically Signed   By: Misty Stanley M.D.   On: 07/04/2016 12:13   Dg C-arm 1-60 Min-no Report  Result Date: 07/04/2016 Fluoroscopy was utilized by the requesting physician.  No radiographic interpretation.    Procedures Procedures (including critical care time)  Medications Ordered in ED Medications - No data to display   Initial Impression / Assessment and Plan / ED Course  I have reviewed the triage vital signs and the nursing notes.  Pertinent labs & imaging results that were available during my care of the patient were reviewed by me and considered in my medical decision making (see chart for details).  Findings concerning for worsening right-sided pleural effusion. Patient required supplemental oxygen for appropriate oxygenation, remains tachycardic, sinus tachycardia on monitor.  Final Clinical Impressions(s) / ED Diagnoses  Patient with recently diagnosed cancer presents with worsening dyspnea, pain. Patient found to have a large right-sided pleural effusion. Patient requires supplemental oxygen for appropriate oxygenation. Patient is mentating appropriately, but has persistent mild hypotension as well. Patient has had prior thoracentesis, and requires admission for additional procedure, as well as ongoing  evaluation, management of her malignancy.    Carmin Muskrat, MD 07/05/16 [phone]

## 2016-07-06 ENCOUNTER — Encounter (HOSPITAL_COMMUNITY): Payer: Self-pay | Admitting: Radiology

## 2016-07-06 ENCOUNTER — Inpatient Hospital Stay (HOSPITAL_COMMUNITY): Payer: Medicare Other

## 2016-07-06 DIAGNOSIS — J90 Pleural effusion, not elsewhere classified: Secondary | ICD-10-CM

## 2016-07-06 DIAGNOSIS — I10 Essential (primary) hypertension: Secondary | ICD-10-CM

## 2016-07-06 DIAGNOSIS — D649 Anemia, unspecified: Secondary | ICD-10-CM

## 2016-07-06 DIAGNOSIS — E785 Hyperlipidemia, unspecified: Secondary | ICD-10-CM

## 2016-07-06 LAB — TROPONIN I: Troponin I: <0.03 ng/mL (ref <0.03)

## 2016-07-06 LAB — COMPREHENSIVE METABOLIC PANEL
ALBUMIN: 2.7 g/dL — AB (ref 3.5–5.0)
ALT: 11 U/L — ABNORMAL LOW (ref 14–54)
ANION GAP: 10 (ref 5–15)
AST: 22 U/L (ref 15–41)
Alkaline Phosphatase: 52 U/L (ref 38–126)
BILIRUBIN TOTAL: 0.9 mg/dL (ref 0.3–1.2)
BUN: 11 mg/dL (ref 6–20)
CHLORIDE: 102 mmol/L (ref 101–111)
CO2: 25 mmol/L (ref 22–32)
Calcium: 9 mg/dL (ref 8.9–10.3)
Creatinine, Ser: 0.83 mg/dL (ref 0.44–1.00)
GFR calc Af Amer: >60 mL/min (ref >60)
GFR calc non Af Amer: >60 mL/min (ref >60)
GLUCOSE: 114 mg/dL — AB (ref 65–99)
POTASSIUM: 3.9 mmol/L (ref 3.5–5.1)
SODIUM: 137 mmol/L (ref 135–145)
Total Protein: 5.6 g/dL — ABNORMAL LOW (ref 6.5–8.1)

## 2016-07-06 LAB — CBC
HEMATOCRIT: 30.9 % — AB (ref 36.0–46.0)
HEMOGLOBIN: 9.9 g/dL — AB (ref 12.0–15.0)
MCH: 32 pg (ref 26.0–34.0)
MCHC: 32 g/dL (ref 30.0–36.0)
MCV: 100 fL (ref 78.0–100.0)
Platelets: 289 10*3/uL (ref 150–400)
RBC: 3.09 MIL/uL — ABNORMAL LOW (ref 3.87–5.11)
RDW: 17.6 % — AB (ref 11.5–15.5)
WBC: 5.1 10*3/uL (ref 4.0–10.5)

## 2016-07-06 LAB — LACTATE DEHYDROGENASE: LDH: 267 U/L — ABNORMAL HIGH (ref 98–192)

## 2016-07-06 MED ORDER — IOPAMIDOL (ISOVUE-370) INJECTION 76%
INTRAVENOUS | Status: AC
  Administered 2016-07-06: 100 mL
  Filled 2016-07-06: qty 100

## 2016-07-06 NOTE — Progress Notes (Addendum)
PROGRESS NOTE        PATIENT DETAILS Name: Robin Hernandez Age: 76 y.o. Sex: female Date of Birth: [birth year] Admit Date: 07/05/2016 Admitting Physician Jani Gravel, MD PCP:No PCP Per Patient  Brief Narrative: Patient is a 76 y.o. 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 Subsequently admitted for further evaluation and treatment.   Subjective: Seems comfortable this am-but gets dyspneic with slight exertion.   Assessment/Plan: Large recurrent right sided pleural effusion: per chart review-known hx of malignant pleural effusion, Per patient she had approximately 4 thoracocentesis in the past 6 weeks or so. Spoke with oncology on call-Dr [facility] agree's with plans for pleurex catheter.Will consult IR  Chest pain likely Non cardiac chest pain: suspect related to known lung/pleural malignancy-troponin negative. Doubt further work up required.   Small cell cancer of the lung: followed by Oncology in [place] and at [facility]-due to numerous issues (shortage of chemotx, leukopenia) she has only received one round of chemotherapy. She is scheduled for chemotherapy on 2/26 and 2/27 at [facility].  Normocytic Anemia: likely 2/2 malignancy  HTN: controlled-continue Metoprolol  Dyslipidemia:continue Statin  DVT Prophylaxis: Prophylactic Lovenox   Code Status: Full code   Family Communication: None at bedside  Disposition Plan: Remain inpatient-home in the next day or so  Antimicrobial agents: Anti-infectives    None     Procedures: nONE  CONSULTS: IR  Time spent: 25 minutes-Greater than 50% of this time was spent in counseling, explanation of diagnosis, planning of further management, and coordination of care.  MEDICATIONS: Scheduled Meds: .  enoxaparin (LOVENOX) injection  40 mg Subcutaneous QHS  . metoprolol tartrate  25 mg Oral Daily  . simvastatin  40 mg Oral q1800   Continuous Infusions: PRN Meds:.acetaminophen **OR** acetaminophen, albuterol, diphenhydrAMINE, morphine injection, ondansetron (ZOFRAN) IV, ondansetron, prochlorperazine   PHYSICAL EXAM: Vital signs: Vitals:   07/05/16 2030 07/05/16 2100 07/05/16 2156 07/06/16 0457  BP: 134/69 110/57 116/62 (!) 126/55  Pulse: 116 106 (!) 109 94  Resp: '22 22 20 18  '$ Temp:   98.2 F (36.8 C) 98.6 F (37 C)  TempSrc:   Oral Oral  SpO2: 99% 98% 100% 97%  Weight:   85 kg (187 lb 8 oz) 85 kg (187 lb 4.8 oz)  Height:   '5\' 3"'$  (1.6 m)    Filed Weights   07/05/16 1613 07/05/16 2156 07/06/16 0457  Weight: 84.8 kg (187 lb) 85 kg (187 lb 8 oz) 85 kg (187 lb 4.8 oz)   Body mass index is 33.18 kg/m.   General appearance :Awake, alert, not in any distress. Speech Clear.  Eyes:, pupils equally reactive to light and accomodation,no scleral icterus HEENT: Atraumatic and Normocephalic Neck: supple, no JVD. No cervical lymphadenopathy.  Resp:decreased air entry on the right side-but otherwise clear CVS: S1 S2 regular, no murmurs.  GI: Bowel sounds present, Non tender and not distended with no gaurding, rigidity or rebound. Extremities: B/L Lower Ext shows no edema, both legs are warm to touch Neurology:  speech clear,Non focal, sensation is grossly intact. Psychiatric: Normal judgment and insight. Alert and oriented x 3.  Musculoskeletal:No digital cyanosis Skin:No Rash, warm and dry Wounds:N/A  I  have personally reviewed following labs and imaging studies  LABORATORY DATA: CBC:  Recent Labs Lab 07/04/16 0920 07/05/16 1642 07/06/16 0500  WBC 5.0 5.4 5.1  NEUTROABS 3.1  --   --   HGB 10.9* 10.8* 9.9*  HCT 33.0* 32.4* 30.9*  MCV 99.4 99.4 100.0  PLT 335 330 366    Basic Metabolic Panel:  Recent Labs Lab 07/05/16 1642 07/06/16 0500  NA 136 137  K 3.5 3.9  CL  103 102  CO2 25 25  GLUCOSE 133* 114*  BUN 17 11  CREATININE 0.88 0.83  CALCIUM 9.3 9.0    GFR: Estimated Creatinine Clearance: 62.3 mL/min (by C-G formula based on SCr of 0.83 mg/dL).  Liver Function Tests:  Recent Labs Lab 07/06/16 0500  AST 22  ALT 11*  ALKPHOS 52  BILITOT 0.9  PROT 5.6*  ALBUMIN 2.7*   No results for input(s): LIPASE, AMYLASE in the last 168 hours. No results for input(s): AMMONIA in the last 168 hours.  Coagulation Profile: No results for input(s): INR, PROTIME in the last 168 hours.  Cardiac Enzymes:  Recent Labs Lab 07/05/16 1642 07/05/16 2230 07/06/16 0500  TROPONINI <0.03 <0.03 <0.03    BNP (last 3 results) No results for input(s): PROBNP in the last 8760 hours.  HbA1C: No results for input(s): HGBA1C in the last 72 hours.  CBG: No results for input(s): GLUCAP in the last 168 hours.  Lipid Profile: No results for input(s): CHOL, HDL, LDLCALC, TRIG, CHOLHDL, LDLDIRECT in the last 72 hours.  Thyroid Function Tests:  Recent Labs  07/05/16 2230  TSH 11.449*    Anemia Panel: No results for input(s): VITAMINB12, FOLATE, FERRITIN, TIBC, IRON, RETICCTPCT in the last 72 hours.  Urine analysis: No results found for: COLORURINE, APPEARANCEUR, LABSPEC, PHURINE, GLUCOSEU, HGBUR, BILIRUBINUR, KETONESUR, PROTEINUR, UROBILINOGEN, NITRITE, LEUKOCYTESUR  Sepsis Labs: Lactic Acid, Venous No results found for: LATICACIDVEN  MICROBIOLOGY: No results found for this or any previous visit (from the past 240 hour(s)).  RADIOLOGY STUDIES/RESULTS: Dg Chest 1 View  Result Date: 06/30/2016 CLINICAL DATA:  Small cell carcinoma RIGHT lung post RIGHT thoracentesis EXAM: CHEST 1 VIEW COMPARISON:  06/16/2016 FINDINGS: Expiratory technique. Accentuated heart size. Atherosclerotic calcification aorta. Mediastinal contours and pulmonary vascularity otherwise normal. Persistent RIGHT basilar effusion atelectasis, underlying tumor not excluded. No  pneumothorax following RIGHT thoracentesis. LEFT lung remains clear. Bones demineralized. IMPRESSION: No pneumothorax following RIGHT thoracentesis. Patient released to home in stable condition with routine postprocedural instructions. Electronically Signed   By: Lavonia Dana M.D.   On: 06/30/2016 14:25   Dg Chest 1 View  Result Date: 06/16/2016 CLINICAL DATA:  Small cell carcinoma of the RIGHT lung, recurrent RIGHT pleural effusion post post thoracentesis EXAM: CHEST 1 VIEW COMPARISON:  Earlier exam of 06/16/2016 FINDINGS: Upper normal heart size. Atherosclerotic calcification aorta. Mediastinal contours and pulmonary vascularity normal. Decrease in RIGHT pleural effusion post thoracentesis and removal of 1.3 L of fluid. Residual RIGHT pleural effusion and basilar atelectasis. No pneumothorax. Bones demineralized. IMPRESSION: No pneumothorax following RIGHT thoracentesis. Decreased RIGHT pleural effusion and basilar atelectasis. Patient asymptomatic following chest radiograph and discharged home with routine instructions post thoracentesis. Electronically Signed   By: Lavonia Dana M.D.   On: 06/16/2016 11:34   Dg Chest 1 View  Result Date: 06/16/2016 CLINICAL DATA:  Small cell carcinoma of the RIGHT lung, RIGHT pleural effusion, history hypertension EXAM: CHEST 1 VIEW COMPARISON:  None FINDINGS: Upper normal heart size. Atherosclerotic calcification aorta. Mediastinal contours and pulmonary vascularity  normal. Moderate to large RIGHT pleural effusion with associated RIGHT lung atelectasis. Remaining lungs clear. No pneumothorax or LEFT pleural effusion. Bones unremarkable. IMPRESSION: Moderate to large RIGHT pleural effusion with atelectasis of the mid to lower RIGHT lung. Electronically Signed   By: Lavonia Dana M.D.   On: 06/16/2016 10:11   Dg Chest 2 View  Result Date: 07/05/2016 CLINICAL DATA:  Chest pain and shortness of breath. History of lung cancer. EXAM: CHEST  2 VIEW COMPARISON:  07/04/2016  FINDINGS: There is a right chest wall port a catheter noted with tip in the projection of the SVC. Aortic atherosclerosis noted. Interval progressive re-accumulation of right pleural fluid since thoracentesis from 2/19/ 18. IMPRESSION: 1. Increase in volume of right pleural effusion with worsening aeration to the right lung. Electronically Signed   By: Kerby Moors M.D.   On: 07/05/2016 17:33   Ct Angio Chest Pe W Or Wo Contrast  Result Date: 07/06/2016 CLINICAL DATA:  76 year old female with elevated D-dimer. History of lung cancer. EXAM: CT ANGIOGRAPHY CHEST WITH CONTRAST TECHNIQUE: Multidetector CT imaging of the chest was performed using the standard protocol during bolus administration of intravenous contrast. Multiplanar CT image reconstructions and MIPs were obtained to evaluate the vascular anatomy. CONTRAST:  100 cc Isovue 370 COMPARISON:  Chest radiograph dated 07/05/2016 FINDINGS: Cardiovascular: There is no cardiomegaly or pericardial effusion. Multi vessel coronary vascular calcification predominantly involving the LAD. There is mild atherosclerotic calcification of the thoracic aorta. No aneurysmal dilatation or evidence of dissection. The origins of the great vessels of the aortic arch appear patent. Evaluation of the pulmonary arteries is limited due to suboptimal visualization of the peripheral branches and respiratory motion artifact. No central pulmonary artery embolus identified. Mediastinum/Nodes: There is a 3.5 x 3.6 cm subcarinal mass/adenopathy. This mass encases the bronchus intermedius with complete occlusion of the distal portion of the right lower lobe bronchus. There is mild compression of the right upper lobe bronchus. The left bronchi are patent. There is right paratracheal adenopathy measuring up to 16 mm in short axis as well as anterior mediastinal and prevascular adenopathy. Mildly enlarged left hilar lymph nodes measure up to 11 mm short axis. There is mass effect and mild  displacement of the mediastinum to the left by the right pleural effusion. Multiple enlarged lymph node noted at the right cardiophrenic angle measuring up to 19 mm in short axis. Lungs/Pleura: There is only a small aerated portion of the lung in the right upper lobe. There is complete consolidative changes of the right lower lobe. The left lung is clear. There is a large right pleural effusion most consistent with malignant ascites. Areas of nodular thickening involving the right pleural consistent with pleural implants. Upper Abdomen: No acute abnormality. Musculoskeletal: Right supraclavicular adenopathy measures 15 mm in short axis. Mildly enlarged left axillary lymph node measures approximately 10 mm in short axis. There is osteopenia with mild degenerative changes of spine. No acute osseous pathology. Heterogeneity of the posterior aspect of the right second rib may be chronic or represent metastatic disease. Review of the MIP images confirms the above findings. IMPRESSION: 1. No CT evidence of central pulmonary artery embolus. 2. Subcarinal mass/adenopathy with encasement of the right bronchus intermedius and complete occlusion of the right lower lobe bronchus. 3. Large right pleural effusion most consistent with malignant ascites. Areas of nodular thickening along the right pleura compatible with pleural-based metastatic implant. 4. Near complete consolidative changes of the right lung with only a small aerated portion  of the lung in the right upper lobe. The left lung remains clear. 5. Mediastinal, right cardiophrenic, and right supraclavicular adenopathy. Mildly enlarged rounded lymph node in the left axilla. 6. Area of heterogeneity in the posterior aspect of the right second rib may be chronic or represent metastatic disease. This can be further evaluated with bone scan if clinically indicated. Electronically Signed   By: Anner Crete M.D.   On: 07/06/2016 04:11   Dg Chest Port 1 View  Result  Date: 07/04/2016 CLINICAL DATA:  Port-A-Cath placement. EXAM: PORTABLE CHEST 1 VIEW COMPARISON:  06/30/2016 FINDINGS: Right-sided Port-A-Cath is new in the interval with the tip overlying the proximal SVC region. No evidence for right pneumothorax. Prominent interval progression of right pleural effusion now large in character. Left lung is clear. The cardio pericardial silhouette is enlarged. The visualized bony structures of the thorax are intact. IMPRESSION: No evidence for pneumothorax status post Port-A-Cath placement. Electronically Signed   By: Misty Stanley M.D.   On: 07/04/2016 12:13   Dg C-arm 1-60 Min-no Report  Result Date: 07/04/2016 Fluoroscopy was utilized by the requesting physician.  No radiographic interpretation.   US Thoracentesis Asp Pleural Space W/img Guide  Result Date: 06/30/2016 INDICATION: Small cell carcinoma of the RIGHT lung, recurrent RIGHT pleural effusion EXAM: ULTRASOUND GUIDED THERAPEUTIC RIGHT THORACENTESIS MEDICATIONS: None. COMPLICATIONS: None immediate. PROCEDURE: Procedure, benefits, and risks of procedure were discussed with patient. Written informed consent for procedure was obtained. Time out protocol followed. Pleural effusion localized by ultrasound at the posterior RIGHT hemithorax. Skin prepped and draped in usual sterile fashion. Skin and soft tissues anesthetized with 13 mL of 1% lidocaine. 8 French thoracentesis catheter placed into the RIGHT pleural space. 1.5 L of serosanguineous RIGHT pleural fluid aspirated by syringe pump. Procedure terminated when the patient began to develop pain in the RIGHT chest. Procedure tolerated well by patient without immediate complication. FINDINGS: As above IMPRESSION: Successful ultrasound guided RIGHT thoracentesis yielding 1.5 L of pleural fluid. Electronically Signed   By: Lavonia Dana M.D.   On: 06/30/2016 14:24   US Thoracentesis Asp Pleural Space W/img Guide  Result Date: 06/16/2016 INDICATION: Small cell carcinoma  of the RIGHT lung, recurrent RIGHT pleural effusion EXAM: ULTRASOUND GUIDED THERAPEUTIC RIGHT THORACENTESIS MEDICATIONS: None. COMPLICATIONS: None immediate. PROCEDURE: Procedure, benefits, and risks of procedure were discussed with patient. Written informed consent for procedure was obtained. Time out protocol followed. Pleural effusion localized by ultrasound at the posterior RIGHT hemithorax. Skin prepped and draped in usual sterile fashion. Skin and soft tissues anesthetized with 10 mL of 1% lidocaine. 8 French thoracentesis catheter placed into the RIGHT pleural space. 1.3 L of serosanguineous aspirated by syringe pump. Procedure tolerated well by patient without immediate complication. FINDINGS: As above IMPRESSION: Successful ultrasound guided RIGHT thoracentesis yielding 1.3 L of pleural fluid. Electronically Signed   By: Lavonia Dana M.D.   On: 06/16/2016 11:29     LOS: 1 day   Oren Binet, MD  [facility] Pager:[phone]  If 7PM-7AM, please contact night-coverage [email or web address] Password [facility] 07/06/2016, 8:49 AM

## 2016-07-07 ENCOUNTER — Ambulatory Visit (HOSPITAL_COMMUNITY): Payer: No Typology Code available for payment source

## 2016-07-07 ENCOUNTER — Inpatient Hospital Stay (HOSPITAL_COMMUNITY): Payer: Medicare Other

## 2016-07-07 ENCOUNTER — Telehealth (HOSPITAL_COMMUNITY): Payer: Self-pay | Admitting: Adult Health

## 2016-07-07 ENCOUNTER — Encounter (HOSPITAL_COMMUNITY): Payer: Self-pay | Admitting: General Surgery

## 2016-07-07 DIAGNOSIS — J9611 Chronic respiratory failure with hypoxia: Secondary | ICD-10-CM

## 2016-07-07 DIAGNOSIS — C801 Malignant (primary) neoplasm, unspecified: Secondary | ICD-10-CM

## 2016-07-07 HISTORY — PX: IR GENERIC HISTORICAL: IMG1180011

## 2016-07-07 LAB — PROTIME-INR
INR: 1.06
PROTHROMBIN TIME: 13.8 s (ref 11.4–15.2)

## 2016-07-07 LAB — LACTATE DEHYDROGENASE, PLEURAL OR PERITONEAL FLUID: LD FL: 449 U/L — AB (ref 3–23)

## 2016-07-07 LAB — BODY FLUID CELL COUNT WITH DIFFERENTIAL
LYMPHS FL: 95 %
MONOCYTE-MACROPHAGE-SEROUS FLUID: 3 % — AB (ref 50–90)
NEUTROPHIL FLUID: 2 % (ref 0–25)
Total Nucleated Cell Count, Fluid: 48 cu mm (ref 0–1000)

## 2016-07-07 LAB — GRAM STAIN

## 2016-07-07 LAB — PROTEIN, PLEURAL OR PERITONEAL FLUID: TOTAL PROTEIN, FLUID: 3.6 g/dL

## 2016-07-07 MED ORDER — LIDOCAINE HCL (PF) 1 % IJ SOLN
INTRAMUSCULAR | Status: AC
  Filled 2016-07-07: qty 30

## 2016-07-07 MED ORDER — FENTANYL CITRATE (PF) 100 MCG/2ML IJ SOLN
INTRAMUSCULAR | Status: AC
  Filled 2016-07-07: qty 2

## 2016-07-07 MED ORDER — FENTANYL CITRATE (PF) 100 MCG/2ML IJ SOLN
INTRAMUSCULAR | Status: AC | PRN
  Administered 2016-07-07 (×2): 50 ug via INTRAVENOUS
  Administered 2016-07-07: 25 ug via INTRAVENOUS
  Administered 2016-07-07: 50 ug via INTRAVENOUS

## 2016-07-07 MED ORDER — OXYCODONE-ACETAMINOPHEN 5-325 MG PO TABS: 1.0000 | ORAL_TABLET | Freq: Four times a day (QID) | ORAL | 0 refills | Status: AC | PRN

## 2016-07-07 MED ORDER — MIDAZOLAM HCL 2 MG/2ML IJ SOLN
INTRAMUSCULAR | Status: AC
  Filled 2016-07-07: qty 6

## 2016-07-07 MED ORDER — ENOXAPARIN SODIUM 40 MG/0.4ML ~~LOC~~ SOLN: 40.0000 mg | Freq: Every day | SUBCUTANEOUS | Status: DC

## 2016-07-07 MED ORDER — MIDAZOLAM HCL 2 MG/2ML IJ SOLN
INTRAMUSCULAR | Status: AC | PRN
  Administered 2016-07-07 (×3): 1 mg via INTRAVENOUS

## 2016-07-07 NOTE — Sedation Documentation (Signed)
Patient is resting comfortably. 

## 2016-07-07 NOTE — Consult Note (Signed)
Chief Complaint: Patient was seen in consultation today for right pleurX catheter placement Chief Complaint  Patient presents with  . Chest Pain   at the request of Dr Nena Alexander  Referring Physician(s): S Ghimire  Supervising Physician: Arne Cleveland  Patient Status: [facility] - In-pt  History of Present Illness: Robin Hernandez is a 76 y.o. 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 Cancer ([place])    lung  . Hypercholesterolemia   . Hypertension     Past Surgical History:  Procedure Laterality Date  . BREAST SURGERY    . CATARACT EXTRACTION Bilateral    2011-2012  . CHOLECYSTECTOMY  1982    Allergies: Codeine; Prednisone; Streptomycin; and Tape  Medications: Prior to Admission medications   Medication Sig Start Date End Date Taking? Authorizing Provider  acetaminophen (TYLENOL) 500 MG tablet Take 500 mg by mouth every 6 (six) hours as needed for mild pain, moderate pain or headache.   Yes Historical Provider, MD  albuterol (PROVENTIL HFA;VENTOLIN HFA) 108 (90 Base) MCG/ACT inhaler Inhale 2 puffs into the lungs every 4 (four) hours as needed for wheezing or shortness of breath.   Yes Historical Provider, MD  diclofenac (VOLTAREN) 75 MG EC tablet Take 75 mg by mouth at bedtime.    Yes Historical Provider, MD  lidocaine-prilocaine (EMLA) cream Apply to affected area once Patient taking differently: Apply 1 application topically daily as needed (prior to accessing port).  06/12/16  Yes Twana First, MD  metoprolol tartrate (LOPRESSOR) 25 MG tablet Take 25 mg by mouth at bedtime.    Yes Historical Provider, MD  ondansetron (ZOFRAN) 8 MG tablet Take 1 tablet (8 mg total) by mouth 2 (two) times  daily as needed for refractory nausea / vomiting. 06/12/16  Yes Twana First, MD  prochlorperazine (COMPAZINE) 10 MG tablet Take 1 tablet (10 mg total) by mouth every 6 (six) hours as needed (Nausea or vomiting). Patient taking differently: Take 5-10 mg by mouth every 6 (six) hours as needed (Nausea or vomiting).  06/12/16  Yes Twana First, MD  simvastatin (ZOCOR) 40 MG tablet Take 40 mg by mouth at bedtime.    Yes Historical Provider, MD     Family History  Problem Relation Age of Onset  . Parkinson's disease Mother   . Heart attack Mother   . Breast cancer Sister   . Cancer Brother   . Cancer Maternal Aunt   . Cancer Maternal Uncle   . Stroke Paternal Aunt   . Stroke Paternal Uncle   . Heart attack Paternal Uncle   . CAD Maternal Grandmother   . Heart attack Maternal Grandmother     Social History   Social History  . Marital status: Married    Spouse name: N/A  . Number of children: N/A  . Years of education: N/A   Social History Main Topics  . Smoking status: Former Smoker    Packs/day: 0.75    Years: 55.00    Types: Cigarettes    Quit date: 05/25/2016  . Smokeless tobacco: Former Systems developer  . Alcohol use No  . Drug use: No  . Sexual activity: Not Currently   Other Topics Concern  . None  Social History Narrative  . None    Review of Systems: A 12 point ROS discussed and pertinent positives are indicated in the HPI above.  All other systems are negative.  Review of Systems  Constitutional: Positive for activity change, appetite change and fatigue. Negative for fever.  Respiratory: Positive for cough and shortness of breath.   Cardiovascular: Positive for chest pain.  Musculoskeletal: Positive for back pain.  Neurological: Positive for weakness.  Psychiatric/Behavioral: Negative for behavioral problems and confusion.    Vital Signs: BP (!) 108/57 (BP Location: Left Arm)   Pulse (!) 101   Temp 98.3 F (36.8 C) (Oral)   Resp 18   Ht '5\' 3"'$  (1.6 m)   Wt 187 lb  (84.8 kg)   SpO2 99%   BMI 33.13 kg/m   Physical Exam  Constitutional: She is oriented to person, place, and time.  Cardiovascular: Normal rate and regular rhythm.   Pulmonary/Chest: Effort normal and breath sounds normal. No respiratory distress.  Abdominal: Soft. Bowel sounds are normal.  Musculoskeletal: Normal range of motion.  Right shoulder pain  Neurological: She is alert and oriented to person, place, and time.  Skin: Skin is warm and dry.  Psychiatric: She has a normal mood and affect. Her behavior is normal. Judgment and thought content normal.  Nursing note and vitals reviewed.   Mallampati Score:  MD Evaluation Airway: WNL Heart: WNL Abdomen: WNL Chest/ Lungs: WNL ASA  Classification: 3 Mallampati/Airway Score: One  Imaging: Dg Chest 1 View  Result Date: 06/30/2016 CLINICAL DATA:  Small cell carcinoma RIGHT lung post RIGHT thoracentesis EXAM: CHEST 1 VIEW COMPARISON:  06/16/2016 FINDINGS: Expiratory technique. Accentuated heart size. Atherosclerotic calcification aorta. Mediastinal contours and pulmonary vascularity otherwise normal. Persistent RIGHT basilar effusion atelectasis, underlying tumor not excluded. No pneumothorax following RIGHT thoracentesis. LEFT lung remains clear. Bones demineralized. IMPRESSION: No pneumothorax following RIGHT thoracentesis. Patient released to home in stable condition with routine postprocedural instructions. Electronically Signed   By: Lavonia Dana M.D.   On: 06/30/2016 14:25   Dg Chest 1 View  Result Date: 06/16/2016 CLINICAL DATA:  Small cell carcinoma of the RIGHT lung, recurrent RIGHT pleural effusion post post thoracentesis EXAM: CHEST 1 VIEW COMPARISON:  Earlier exam of 06/16/2016 FINDINGS: Upper normal heart size. Atherosclerotic calcification aorta. Mediastinal contours and pulmonary vascularity normal. Decrease in RIGHT pleural effusion post thoracentesis and removal of 1.3 L of fluid. Residual RIGHT pleural effusion and  basilar atelectasis. No pneumothorax. Bones demineralized. IMPRESSION: No pneumothorax following RIGHT thoracentesis. Decreased RIGHT pleural effusion and basilar atelectasis. Patient asymptomatic following chest radiograph and discharged home with routine instructions post thoracentesis. Electronically Signed   By: Lavonia Dana M.D.   On: 06/16/2016 11:34   Dg Chest 1 View  Result Date: 06/16/2016 CLINICAL DATA:  Small cell carcinoma of the RIGHT lung, RIGHT pleural effusion, history hypertension EXAM: CHEST 1 VIEW COMPARISON:  None FINDINGS: Upper normal heart size. Atherosclerotic calcification aorta. Mediastinal contours and pulmonary vascularity normal. Moderate to large RIGHT pleural effusion with associated RIGHT lung atelectasis. Remaining lungs clear. No pneumothorax or LEFT pleural effusion. Bones unremarkable. IMPRESSION: Moderate to large RIGHT pleural effusion with atelectasis of the mid to lower RIGHT lung. Electronically Signed   By: Lavonia Dana M.D.   On: 06/16/2016 10:11   Dg Chest 2 View  Result Date: 07/05/2016 CLINICAL DATA:  Chest pain and shortness of breath. History of lung cancer. EXAM: CHEST  2 VIEW COMPARISON:  07/04/2016 FINDINGS: There is a right chest  wall port a catheter noted with tip in the projection of the SVC. Aortic atherosclerosis noted. Interval progressive re-accumulation of right pleural fluid since thoracentesis from 2/19/ 18. IMPRESSION: 1. Increase in volume of right pleural effusion with worsening aeration to the right lung. Electronically Signed   By: Kerby Moors M.D.   On: 07/05/2016 17:33   Ct Angio Chest Pe W Or Wo Contrast  Result Date: 07/06/2016 CLINICAL DATA:  76 year old female with elevated D-dimer. History of lung cancer. EXAM: CT ANGIOGRAPHY CHEST WITH CONTRAST TECHNIQUE: Multidetector CT imaging of the chest was performed using the standard protocol during bolus administration of intravenous contrast. Multiplanar CT image reconstructions and MIPs  were obtained to evaluate the vascular anatomy. CONTRAST:  100 cc Isovue 370 COMPARISON:  Chest radiograph dated 07/05/2016 FINDINGS: Cardiovascular: There is no cardiomegaly or pericardial effusion. Multi vessel coronary vascular calcification predominantly involving the LAD. There is mild atherosclerotic calcification of the thoracic aorta. No aneurysmal dilatation or evidence of dissection. The origins of the great vessels of the aortic arch appear patent. Evaluation of the pulmonary arteries is limited due to suboptimal visualization of the peripheral branches and respiratory motion artifact. No central pulmonary artery embolus identified. Mediastinum/Nodes: There is a 3.5 x 3.6 cm subcarinal mass/adenopathy. This mass encases the bronchus intermedius with complete occlusion of the distal portion of the right lower lobe bronchus. There is mild compression of the right upper lobe bronchus. The left bronchi are patent. There is right paratracheal adenopathy measuring up to 16 mm in short axis as well as anterior mediastinal and prevascular adenopathy. Mildly enlarged left hilar lymph nodes measure up to 11 mm short axis. There is mass effect and mild displacement of the mediastinum to the left by the right pleural effusion. Multiple enlarged lymph node noted at the right cardiophrenic angle measuring up to 19 mm in short axis. Lungs/Pleura: There is only a small aerated portion of the lung in the right upper lobe. There is complete consolidative changes of the right lower lobe. The left lung is clear. There is a large right pleural effusion most consistent with malignant ascites. Areas of nodular thickening involving the right pleural consistent with pleural implants. Upper Abdomen: No acute abnormality. Musculoskeletal: Right supraclavicular adenopathy measures 15 mm in short axis. Mildly enlarged left axillary lymph node measures approximately 10 mm in short axis. There is osteopenia with mild degenerative  changes of spine. No acute osseous pathology. Heterogeneity of the posterior aspect of the right second rib may be chronic or represent metastatic disease. Review of the MIP images confirms the above findings. IMPRESSION: 1. No CT evidence of central pulmonary artery embolus. 2. Subcarinal mass/adenopathy with encasement of the right bronchus intermedius and complete occlusion of the right lower lobe bronchus. 3. Large right pleural effusion most consistent with malignant ascites. Areas of nodular thickening along the right pleura compatible with pleural-based metastatic implant. 4. Near complete consolidative changes of the right lung with only a small aerated portion of the lung in the right upper lobe. The left lung remains clear. 5. Mediastinal, right cardiophrenic, and right supraclavicular adenopathy. Mildly enlarged rounded lymph node in the left axilla. 6. Area of heterogeneity in the posterior aspect of the right second rib may be chronic or represent metastatic disease. This can be further evaluated with bone scan if clinically indicated. Electronically Signed   By: Anner Crete M.D.   On: 07/06/2016 04:11   Dg Chest Port 1 View  Result Date: 07/04/2016 CLINICAL DATA:  Port-A-Cath  placement. EXAM: PORTABLE CHEST 1 VIEW COMPARISON:  06/30/2016 FINDINGS: Right-sided Port-A-Cath is new in the interval with the tip overlying the proximal SVC region. No evidence for right pneumothorax. Prominent interval progression of right pleural effusion now large in character. Left lung is clear. The cardio pericardial silhouette is enlarged. The visualized bony structures of the thorax are intact. IMPRESSION: No evidence for pneumothorax status post Port-A-Cath placement. Electronically Signed   By: Misty Stanley M.D.   On: 07/04/2016 12:13   Dg C-arm 1-60 Min-no Report  Result Date: 07/04/2016 Fluoroscopy was utilized by the requesting physician.  No radiographic interpretation.   US Thoracentesis Asp  Pleural Space W/img Guide  Result Date: 06/30/2016 INDICATION: Small cell carcinoma of the RIGHT lung, recurrent RIGHT pleural effusion EXAM: ULTRASOUND GUIDED THERAPEUTIC RIGHT THORACENTESIS MEDICATIONS: None. COMPLICATIONS: None immediate. PROCEDURE: Procedure, benefits, and risks of procedure were discussed with patient. Written informed consent for procedure was obtained. Time out protocol followed. Pleural effusion localized by ultrasound at the posterior RIGHT hemithorax. Skin prepped and draped in usual sterile fashion. Skin and soft tissues anesthetized with 13 mL of 1% lidocaine. 8 French thoracentesis catheter placed into the RIGHT pleural space. 1.5 L of serosanguineous RIGHT pleural fluid aspirated by syringe pump. Procedure terminated when the patient began to develop pain in the RIGHT chest. Procedure tolerated well by patient without immediate complication. FINDINGS: As above IMPRESSION: Successful ultrasound guided RIGHT thoracentesis yielding 1.5 L of pleural fluid. Electronically Signed   By: Lavonia Dana M.D.   On: 06/30/2016 14:24   US Thoracentesis Asp Pleural Space W/img Guide  Result Date: 06/16/2016 INDICATION: Small cell carcinoma of the RIGHT lung, recurrent RIGHT pleural effusion EXAM: ULTRASOUND GUIDED THERAPEUTIC RIGHT THORACENTESIS MEDICATIONS: None. COMPLICATIONS: None immediate. PROCEDURE: Procedure, benefits, and risks of procedure were discussed with patient. Written informed consent for procedure was obtained. Time out protocol followed. Pleural effusion localized by ultrasound at the posterior RIGHT hemithorax. Skin prepped and draped in usual sterile fashion. Skin and soft tissues anesthetized with 10 mL of 1% lidocaine. 8 French thoracentesis catheter placed into the RIGHT pleural space. 1.3 L of serosanguineous aspirated by syringe pump. Procedure tolerated well by patient without immediate complication. FINDINGS: As above IMPRESSION: Successful ultrasound guided RIGHT  thoracentesis yielding 1.3 L of pleural fluid. Electronically Signed   By: Lavonia Dana M.D.   On: 06/16/2016 11:29    Labs:  CBC:  Recent Labs  06/16/16 1203 07/04/16 0920 07/05/16 1642 07/06/16 0500  WBC 1.5* 5.0 5.4 5.1  HGB 11.2* 10.9* 10.8* 9.9*  HCT 33.4* 33.0* 32.4* 30.9*  PLT 34* 335 330 289    COAGS:  Recent Labs  07/07/16 0003  INR 1.06    BMP:  Recent Labs  06/16/16 1203 07/05/16 1642 07/06/16 0500  NA 140 136 137  K 3.7 3.5 3.9  CL 105 103 102  CO2 '27 25 25  '$ GLUCOSE 100* 133* 114*  BUN '16 17 11  '$ CALCIUM 8.7* 9.3 9.0  CREATININE 0.92 0.88 0.83  GFRNONAA >60 >60 >60  GFRAA >60 >60 >60    LIVER FUNCTION TESTS:  Recent Labs  06/16/16 1203 07/06/16 0500  BILITOT 1.5* 0.9  AST 22 22  ALT 21 11*  ALKPHOS 68 52  PROT 6.6 5.6*  ALBUMIN 3.4* 2.7*    TUMOR MARKERS: No results for input(s): AFPTM, CEA, CA199, CHROMGRNA in the last 8760 hours.  Assessment and Plan:  Lung Ca Right pleural effusion: recurrent; malignant Now scheduled for Rt PleurX catheter  placement Risks and Benefits discussed with the patient including bleeding, infection, damage to adjacent structures, pneumothorax and sepsis. All of the patient's questions were answered, patient is agreeable to proceed. Consent signed and in chart.  Thank you for this interesting consult.  I greatly enjoyed meeting Robin Hernandez and look forward to participating in their care.  A copy of this report was sent to the requesting provider on this date.  Electronically Signed: B[place]na Grindstaff A 07/07/2016, 10:00 AM   I spent a total of 40 Minutes    in face to face in clinical consultation, greater than 50% of which was counseling/coordinating care for right pleurx catheter

## 2016-07-07 NOTE — Discharge Summary (Addendum)
PATIENT DETAILS Name: Robin Hernandez Age: 77 y.o. Sex: female Date of Birth: 21-Jan-1941 MRN: [id number]. Admitting Physician: Jani Gravel, MD PCP:No PCP Per Patient  Admit Date: 07/05/2016 Discharge date: 07/07/2016  Recommendations for Outpatient Follow-up:  1. Follow up with PCP in 1-2 weeks 2. Please obtain BMP/CBC in one week 3. Please ensure follow up with Oncology  Admitted From:  Home  Disposition: [place]: Yes-HHRN  Equipment/Devices: Pleurx  2/26  Discharge Condition: Stable  CODE STATUS: FULL CODE  Diet recommendation:  Heart Healthy   Brief Summary: See H&P, Labs, Consult and Test reports for all details in brief, Patient is a 76 y.o. 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 Subsequently admitted for further evaluation and treatment.   Brief Hospital Course: Large recurrent right sided pleural effusion: per chart review-known hx of malignant pleural effusion, Per patient she had approximately 4 thoracocentesis in the past 6 weeks or so. Given requirement for repeated thoracocentesis, IR consulted for Pleurex catheter placement. Pleurex catheter was placed on 2/26-will be discharged home with [facility]. Have asked patient to follow up at [facility] tomorrow for resumption of chemotherapy  Chest pain likely Non cardiac chest pain: suspect related to known lung/pleural malignancy-troponin negative. Doubt further work up required.   Small cell cancer of the lung: followed by Oncology in [place] and at [facility]-due to numerous issues (shortage of chemotx, leukopenia) she has only received one round of chemotherapy. She was scheduled for chemotherapy on 2/26,have spoke with Mike Craze NP at the cancer center-they will reschedule appointment  for chemotherapy 2/27.  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  Procedures/Studies: Pleurex catheter 2/26>>  Discharge Diagnoses:  Active Problems:   Dyspnea   Tachycardia   Anemia   Recurrent right pleural effusion   Discharge Instructions:  Activity:  As tolerated with Full fall precautions use walker/cane & assistance as needed  Allergies as of 07/07/2016      Reactions   Codeine Hives   Prednisone Other (See Comments)   Reaction:  Altered mental status    Streptomycin Hives   Tape Rash      Medication List    TAKE these medications   acetaminophen 500 MG tablet Commonly known as:  TYLENOL Take 500 mg by mouth every 6 (six) hours as needed for mild pain, moderate pain or headache.   albuterol 108 (90 Base) MCG/ACT inhaler Commonly known as:  PROVENTIL HFA;VENTOLIN HFA Inhale 2 puffs into the lungs every 4 (four) hours as needed for wheezing or shortness of breath.   diclofenac 75 MG EC tablet Commonly known as:  VOLTAREN Take 75 mg by mouth at bedtime.   lidocaine-prilocaine cream Commonly known as:  EMLA Apply to affected area once What changed:  how much to take  how to take this  when to take this  reasons to take this  additional instructions   metoprolol tartrate 25 MG tablet Commonly known as:  LOPRESSOR Take 25 mg by mouth at bedtime.   ondansetron 8 MG tablet Commonly known as:  ZOFRAN Take 1 tablet (8 mg total) by mouth 2 (two) times daily as needed for refractory nausea / vomiting.   oxyCODONE-acetaminophen 5-325 MG tablet Commonly  known as:  ROXICET Take 1 tablet by mouth every 6 (six) hours as needed for severe pain.   prochlorperazine 10 MG tablet Commonly known as:  COMPAZINE Take 1 tablet (10 mg total) by mouth every 6 (six) hours as needed (Nausea or vomiting). What changed:  how much to take     simvastatin 40 MG tablet Commonly known as:  ZOCOR Take 40 mg by mouth at bedtime.      Follow-up Information    Twana First, MD Follow up.   Specialty:  Oncology Why:  FOLLOW AT [facility] 2/27 Contact information: [place] [id number] [phone]        [facility] Follow up.   Why:  HHRN arranged- for pleurX cath needs- to f/u with Dr. Talbert Cage for days that cath needs to be drained Contact information: [phone]         Allergies  Allergen Reactions  . Codeine Hives  . Prednisone Other (See Comments)    Reaction:  Altered mental status   . Streptomycin Hives  . Tape Rash    Consultations:   IR  Other Procedures/Studies: Dg Chest 1 View  Result Date: 06/30/2016 CLINICAL DATA:  Small cell carcinoma RIGHT lung post RIGHT thoracentesis EXAM: CHEST 1 VIEW COMPARISON:  06/16/2016 FINDINGS: Expiratory technique. Accentuated heart size. Atherosclerotic calcification aorta. Mediastinal contours and pulmonary vascularity otherwise normal. Persistent RIGHT basilar effusion atelectasis, underlying tumor not excluded. No pneumothorax following RIGHT thoracentesis. LEFT lung remains clear. Bones demineralized. IMPRESSION: No pneumothorax following RIGHT thoracentesis. Patient released to home in stable condition with routine postprocedural instructions. Electronically Signed   By: Lavonia Dana M.D.   On: 06/30/2016 14:25   Dg Chest 1 View  Result Date: 06/16/2016 CLINICAL DATA:  Small cell carcinoma of the RIGHT lung, recurrent RIGHT pleural effusion post post thoracentesis EXAM: CHEST 1 VIEW COMPARISON:  Earlier exam of 06/16/2016 FINDINGS: Upper normal heart size. Atherosclerotic calcification aorta. Mediastinal contours and pulmonary vascularity normal. Decrease in RIGHT pleural effusion post thoracentesis and removal of 1.3 L of fluid. Residual RIGHT pleural effusion and basilar atelectasis. No pneumothorax. Bones demineralized.  IMPRESSION: No pneumothorax following RIGHT thoracentesis. Decreased RIGHT pleural effusion and basilar atelectasis. Patient asymptomatic following chest radiograph and discharged home with routine instructions post thoracentesis. Electronically Signed   By: Lavonia Dana M.D.   On: 06/16/2016 11:34   Dg Chest 1 View  Result Date: 06/16/2016 CLINICAL DATA:  Small cell carcinoma of the RIGHT lung, RIGHT pleural effusion, history hypertension EXAM: CHEST 1 VIEW COMPARISON:  None FINDINGS: Upper normal heart size. Atherosclerotic calcification aorta. Mediastinal contours and pulmonary vascularity normal. Moderate to large RIGHT pleural effusion with associated RIGHT lung atelectasis. Remaining lungs clear. No pneumothorax or LEFT pleural effusion. Bones unremarkable. IMPRESSION: Moderate to large RIGHT pleural effusion with atelectasis of the mid to lower RIGHT lung. Electronically Signed   By: Lavonia Dana M.D.   On: 06/16/2016 10:11   Dg Chest 2 View  Result Date: 07/05/2016 CLINICAL DATA:  Chest pain and shortness of breath. History of lung cancer. EXAM: CHEST  2 VIEW COMPARISON:  07/04/2016 FINDINGS: There is a right chest wall port a catheter noted with tip in the projection of the SVC. Aortic atherosclerosis noted. Interval progressive re-accumulation of right pleural fluid since thoracentesis from 2/19/ 18. IMPRESSION: 1. Increase in volume of right pleural effusion with worsening aeration to the right lung. Electronically Signed   By: Kerby Moors M.D.   On:  07/05/2016 17:33   Ct Angio Chest Pe W Or Wo Contrast  Result Date: 07/06/2016 CLINICAL DATA:  76 year old female with elevated D-dimer. History of lung cancer. EXAM: CT ANGIOGRAPHY CHEST WITH CONTRAST TECHNIQUE: Multidetector CT imaging of the chest was performed using the standard protocol during bolus administration of intravenous contrast. Multiplanar CT image reconstructions and MIPs were obtained to evaluate the vascular anatomy. CONTRAST:   100 cc Isovue 370 COMPARISON:  Chest radiograph dated 07/05/2016 FINDINGS: Cardiovascular: There is no cardiomegaly or pericardial effusion. Multi vessel coronary vascular calcification predominantly involving the LAD. There is mild atherosclerotic calcification of the thoracic aorta. No aneurysmal dilatation or evidence of dissection. The origins of the great vessels of the aortic arch appear patent. Evaluation of the pulmonary arteries is limited due to suboptimal visualization of the peripheral branches and respiratory motion artifact. No central pulmonary artery embolus identified. Mediastinum/Nodes: There is a 3.5 x 3.6 cm subcarinal mass/adenopathy. This mass encases the bronchus intermedius with complete occlusion of the distal portion of the right lower lobe bronchus. There is mild compression of the right upper lobe bronchus. The left bronchi are patent. There is right paratracheal adenopathy measuring up to 16 mm in short axis as well as anterior mediastinal and prevascular adenopathy. Mildly enlarged left hilar lymph nodes measure up to 11 mm short axis. There is mass effect and mild displacement of the mediastinum to the left by the right pleural effusion. Multiple enlarged lymph node noted at the right cardiophrenic angle measuring up to 19 mm in short axis. Lungs/Pleura: There is only a small aerated portion of the lung in the right upper lobe. There is complete consolidative changes of the right lower lobe. The left lung is clear. There is a large right pleural effusion most consistent with malignant ascites. Areas of nodular thickening involving the right pleural consistent with pleural implants. Upper Abdomen: No acute abnormality. Musculoskeletal: Right supraclavicular adenopathy measures 15 mm in short axis. Mildly enlarged left axillary lymph node measures approximately 10 mm in short axis. There is osteopenia with mild degenerative changes of spine. No acute osseous pathology. Heterogeneity of  the posterior aspect of the right second rib may be chronic or represent metastatic disease. Review of the MIP images confirms the above findings. IMPRESSION: 1. No CT evidence of central pulmonary artery embolus. 2. Subcarinal mass/adenopathy with encasement of the right bronchus intermedius and complete occlusion of the right lower lobe bronchus. 3. Large right pleural effusion most consistent with malignant ascites. Areas of nodular thickening along the right pleura compatible with pleural-based metastatic implant. 4. Near complete consolidative changes of the right lung with only a small aerated portion of the lung in the right upper lobe. The left lung remains clear. 5. Mediastinal, right cardiophrenic, and right supraclavicular adenopathy. Mildly enlarged rounded lymph node in the left axilla. 6. Area of heterogeneity in the posterior aspect of the right second rib may be chronic or represent metastatic disease. This can be further evaluated with bone scan if clinically indicated. Electronically Signed   By: Anner Crete M.D.   On: 07/06/2016 04:11   Ir Guided Niel Hummer W Catheter Placement  Result Date: 07/07/2016 CLINICAL DATA:  Large malignant right pleural effusion post multiple thoracentesis . EXAM: INSERTION OF TUNNELED PLEURAL DRAINAGE CATHETER ANESTHESIA/SEDATION: Intravenous Fentanyl and Versed were administered as conscious sedation during continuous monitoring of the patient's level of consciousness and physiological / cardiorespiratory status by the radiology RN, with a total moderate sedation time of 15 minutes. MEDICATIONS: Lidocaine 1%  subcutaneous FLUOROSCOPY TIME:  Less than 6 seconds PROCEDURE: The procedure, risks, benefits, and alternatives were explained to the patient. Questions regarding the procedure were encouraged and answered. The patient understands and consents to the procedure. The right chest wall was prepped with Betadine in a sterile fashion, and a sterile drape was applied  covering the operative field. A sterile gown and sterile gloves were used for the procedure. Local anesthesia was provided with 1% Lidocaine. Ultrasound image documentation was performed. Fluoroscopy during the procedure and fluoroscopic spot radiograph confirms appropriate catheter position. After creating a small skin incision, a 19 gauge sheath needle was advanced into the pleural cavity under ultrasound guidance. A guide wire was then advanced under fluoroscopy into the pleural space. Pleural access was dilated serially and a 16-French peel-away sheath placed. A 16 French tunneled PleurX catheter was placed. This was tunneled from an incision 5 cm below the pleural access to the access site. The catheter was advanced through the peel-away sheath. The sheath was then removed. Final catheter positioning was confirmed with a fluoroscopic spot image. The access incision was closed with Dermabond applied to the incision. A Prolene retention suture was applied at the catheter exit site. Large volume 2L thoracentesis was performed through the new catheter utilizing gravity drainage bag. COMPLICATIONS: None. FINDINGS: The catheter was placed via the right lateral chest wall. Catheter course is towards the apex. Approximately 2liters of pleural fluid was able to be removed after catheter placement. The patient will be brought back in 10 to 14days to remove the temporary exit site retention suture after appropriate in-growth around the subcutaneous cuff of the catheter. IMPRESSION: Placement of permanent, tunneled right pleural drainage catheter via lateral approach. 2 liters of pleural fluid was removed today after catheter placement. Electronically Signed   By: Lucrezia Europe M.D.   On: 07/07/2016 16:26   Dg Chest Port 1 View  Result Date: 07/04/2016 CLINICAL DATA:  Port-A-Cath placement. EXAM: PORTABLE CHEST 1 VIEW COMPARISON:  06/30/2016 FINDINGS: Right-sided Port-A-Cath is new in the interval with the tip overlying  the proximal SVC region. No evidence for right pneumothorax. Prominent interval progression of right pleural effusion now large in character. Left lung is clear. The cardio pericardial silhouette is enlarged. The visualized bony structures of the thorax are intact. IMPRESSION: No evidence for pneumothorax status post Port-A-Cath placement. Electronically Signed   By: Misty Stanley M.D.   On: 07/04/2016 12:13   Dg C-arm 1-60 Min-no Report  Result Date: 07/04/2016 Fluoroscopy was utilized by the requesting physician.  No radiographic interpretation.   US Thoracentesis Asp Pleural Space W/img Guide  Result Date: 06/30/2016 INDICATION: Small cell carcinoma of the RIGHT lung, recurrent RIGHT pleural effusion EXAM: ULTRASOUND GUIDED THERAPEUTIC RIGHT THORACENTESIS MEDICATIONS: None. COMPLICATIONS: None immediate. PROCEDURE: Procedure, benefits, and risks of procedure were discussed with patient. Written informed consent for procedure was obtained. Time out protocol followed. Pleural effusion localized by ultrasound at the posterior RIGHT hemithorax. Skin prepped and draped in usual sterile fashion. Skin and soft tissues anesthetized with 13 mL of 1% lidocaine. 8 French thoracentesis catheter placed into the RIGHT pleural space. 1.5 L of serosanguineous RIGHT pleural fluid aspirated by syringe pump. Procedure terminated when the patient began to develop pain in the RIGHT chest. Procedure tolerated well by patient without immediate complication. FINDINGS: As above IMPRESSION: Successful ultrasound guided RIGHT thoracentesis yielding 1.5 L of pleural fluid. Electronically Signed   By: Lavonia Dana M.D.   On: 06/30/2016 14:24   US Thoracentesis  Asp Pleural Space W/img Guide  Result Date: 06/16/2016 INDICATION: Small cell carcinoma of the RIGHT lung, recurrent RIGHT pleural effusion EXAM: ULTRASOUND GUIDED THERAPEUTIC RIGHT THORACENTESIS MEDICATIONS: None. COMPLICATIONS: None immediate. PROCEDURE: Procedure,  benefits, and risks of procedure were discussed with patient. Written informed consent for procedure was obtained. Time out protocol followed. Pleural effusion localized by ultrasound at the posterior RIGHT hemithorax. Skin prepped and draped in usual sterile fashion. Skin and soft tissues anesthetized with 10 mL of 1% lidocaine. 8 French thoracentesis catheter placed into the RIGHT pleural space. 1.3 L of serosanguineous aspirated by syringe pump. Procedure tolerated well by patient without immediate complication. FINDINGS: As above IMPRESSION: Successful ultrasound guided RIGHT thoracentesis yielding 1.3 L of pleural fluid. Electronically Signed   By: Lavonia Dana M.D.   On: 06/16/2016 11:29     TODAY-DAY OF DISCHARGE:  Subjective:   Robin Hernandez today has no headache,no chest abdominal pain,no new weakness tingling or numbness, feels much better wants to go home today.   Objective:   Blood pressure (!) 98/54, pulse 86, temperature 98.4 F (36.9 C), temperature source Oral, resp. rate 18, height '5\' 3"'$  (1.6 m), weight 84.8 kg (187 lb), SpO2 98 %.  Intake/Output Summary (Last 24 hours) at 07/07/16 1721 Last data filed at 07/06/16 1819  Gross per 24 hour  Intake              360 ml  Output                0 ml  Net              360 ml   Filed Weights   07/05/16 2156 07/06/16 0457 07/07/16 0517  Weight: 85 kg (187 lb 8 oz) 85 kg (187 lb 4.8 oz) 84.8 kg (187 lb)    Exam: Awake Alert, Oriented *3, No new F.N deficits, Normal affect [place].AT,PERRAL Supple Neck,No JVD, No cervical lymphadenopathy appriciated.  Symmetrical Chest wall movement, Good air movement bilaterally, CTAB RRR,No Gallops,Rubs or new Murmurs, No Parasternal Heave +ve B.Sounds, Abd Soft, Non tender, No organomegaly appriciated, No rebound -guarding or rigidity. No Cyanosis, Clubbing or edema, No new Rash or bruise   PERTINENT RADIOLOGIC STUDIES: Dg Chest 1 View  Result Date: 06/30/2016 CLINICAL DATA:  Small cell  carcinoma RIGHT lung post RIGHT thoracentesis EXAM: CHEST 1 VIEW COMPARISON:  06/16/2016 FINDINGS: Expiratory technique. Accentuated heart size. Atherosclerotic calcification aorta. Mediastinal contours and pulmonary vascularity otherwise normal. Persistent RIGHT basilar effusion atelectasis, underlying tumor not excluded. No pneumothorax following RIGHT thoracentesis. LEFT lung remains clear. Bones demineralized. IMPRESSION: No pneumothorax following RIGHT thoracentesis. Patient released to home in stable condition with routine postprocedural instructions. Electronically Signed   By: Lavonia Dana M.D.   On: 06/30/2016 14:25   Dg Chest 1 View  Result Date: 06/16/2016 CLINICAL DATA:  Small cell carcinoma of the RIGHT lung, recurrent RIGHT pleural effusion post post thoracentesis EXAM: CHEST 1 VIEW COMPARISON:  Earlier exam of 06/16/2016 FINDINGS: Upper normal heart size. Atherosclerotic calcification aorta. Mediastinal contours and pulmonary vascularity normal. Decrease in RIGHT pleural effusion post thoracentesis and removal of 1.3 L of fluid. Residual RIGHT pleural effusion and basilar atelectasis. No pneumothorax. Bones demineralized. IMPRESSION: No pneumothorax following RIGHT thoracentesis. Decreased RIGHT pleural effusion and basilar atelectasis. Patient asymptomatic following chest radiograph and discharged home with routine instructions post thoracentesis. Electronically Signed   By: Lavonia Dana M.D.   On: 06/16/2016 11:34   Dg Chest 1 View  Result Date: 06/16/2016 CLINICAL  DATA:  Small cell carcinoma of the RIGHT lung, RIGHT pleural effusion, history hypertension EXAM: CHEST 1 VIEW COMPARISON:  None FINDINGS: Upper normal heart size. Atherosclerotic calcification aorta. Mediastinal contours and pulmonary vascularity normal. Moderate to large RIGHT pleural effusion with associated RIGHT lung atelectasis. Remaining lungs clear. No pneumothorax or LEFT pleural effusion. Bones unremarkable. IMPRESSION:  Moderate to large RIGHT pleural effusion with atelectasis of the mid to lower RIGHT lung. Electronically Signed   By: Lavonia Dana M.D.   On: 06/16/2016 10:11   Dg Chest 2 View  Result Date: 07/05/2016 CLINICAL DATA:  Chest pain and shortness of breath. History of lung cancer. EXAM: CHEST  2 VIEW COMPARISON:  07/04/2016 FINDINGS: There is a right chest wall port a catheter noted with tip in the projection of the SVC. Aortic atherosclerosis noted. Interval progressive re-accumulation of right pleural fluid since thoracentesis from 2/19/ 18. IMPRESSION: 1. Increase in volume of right pleural effusion with worsening aeration to the right lung. Electronically Signed   By: Kerby Moors M.D.   On: 07/05/2016 17:33   Ct Angio Chest Pe W Or Wo Contrast  Result Date: 07/06/2016 CLINICAL DATA:  76 year old female with elevated D-dimer. History of lung cancer. EXAM: CT ANGIOGRAPHY CHEST WITH CONTRAST TECHNIQUE: Multidetector CT imaging of the chest was performed using the standard protocol during bolus administration of intravenous contrast. Multiplanar CT image reconstructions and MIPs were obtained to evaluate the vascular anatomy. CONTRAST:  100 cc Isovue 370 COMPARISON:  Chest radiograph dated 07/05/2016 FINDINGS: Cardiovascular: There is no cardiomegaly or pericardial effusion. Multi vessel coronary vascular calcification predominantly involving the LAD. There is mild atherosclerotic calcification of the thoracic aorta. No aneurysmal dilatation or evidence of dissection. The origins of the great vessels of the aortic arch appear patent. Evaluation of the pulmonary arteries is limited due to suboptimal visualization of the peripheral branches and respiratory motion artifact. No central pulmonary artery embolus identified. Mediastinum/Nodes: There is a 3.5 x 3.6 cm subcarinal mass/adenopathy. This mass encases the bronchus intermedius with complete occlusion of the distal portion of the right lower lobe bronchus.  There is mild compression of the right upper lobe bronchus. The left bronchi are patent. There is right paratracheal adenopathy measuring up to 16 mm in short axis as well as anterior mediastinal and prevascular adenopathy. Mildly enlarged left hilar lymph nodes measure up to 11 mm short axis. There is mass effect and mild displacement of the mediastinum to the left by the right pleural effusion. Multiple enlarged lymph node noted at the right cardiophrenic angle measuring up to 19 mm in short axis. Lungs/Pleura: There is only a small aerated portion of the lung in the right upper lobe. There is complete consolidative changes of the right lower lobe. The left lung is clear. There is a large right pleural effusion most consistent with malignant ascites. Areas of nodular thickening involving the right pleural consistent with pleural implants. Upper Abdomen: No acute abnormality. Musculoskeletal: Right supraclavicular adenopathy measures 15 mm in short axis. Mildly enlarged left axillary lymph node measures approximately 10 mm in short axis. There is osteopenia with mild degenerative changes of spine. No acute osseous pathology. Heterogeneity of the posterior aspect of the right second rib may be chronic or represent metastatic disease. Review of the MIP images confirms the above findings. IMPRESSION: 1. No CT evidence of central pulmonary artery embolus. 2. Subcarinal mass/adenopathy with encasement of the right bronchus intermedius and complete occlusion of the right lower lobe bronchus. 3. Large right pleural  effusion most consistent with malignant ascites. Areas of nodular thickening along the right pleura compatible with pleural-based metastatic implant. 4. Near complete consolidative changes of the right lung with only a small aerated portion of the lung in the right upper lobe. The left lung remains clear. 5. Mediastinal, right cardiophrenic, and right supraclavicular adenopathy. Mildly enlarged rounded lymph  node in the left axilla. 6. Area of heterogeneity in the posterior aspect of the right second rib may be chronic or represent metastatic disease. This can be further evaluated with bone scan if clinically indicated. Electronically Signed   By: Anner Crete M.D.   On: 07/06/2016 04:11   Ir Guided Niel Hummer W Catheter Placement  Result Date: 07/07/2016 CLINICAL DATA:  Large malignant right pleural effusion post multiple thoracentesis . EXAM: INSERTION OF TUNNELED PLEURAL DRAINAGE CATHETER ANESTHESIA/SEDATION: Intravenous Fentanyl and Versed were administered as conscious sedation during continuous monitoring of the patient's level of consciousness and physiological / cardiorespiratory status by the radiology RN, with a total moderate sedation time of 15 minutes. MEDICATIONS: Lidocaine 1% subcutaneous FLUOROSCOPY TIME:  Less than 6 seconds PROCEDURE: The procedure, risks, benefits, and alternatives were explained to the patient. Questions regarding the procedure were encouraged and answered. The patient understands and consents to the procedure. The right chest wall was prepped with Betadine in a sterile fashion, and a sterile drape was applied covering the operative field. A sterile gown and sterile gloves were used for the procedure. Local anesthesia was provided with 1% Lidocaine. Ultrasound image documentation was performed. Fluoroscopy during the procedure and fluoroscopic spot radiograph confirms appropriate catheter position. After creating a small skin incision, a 19 gauge sheath needle was advanced into the pleural cavity under ultrasound guidance. A guide wire was then advanced under fluoroscopy into the pleural space. Pleural access was dilated serially and a 16-French peel-away sheath placed. A 16 French tunneled PleurX catheter was placed. This was tunneled from an incision 5 cm below the pleural access to the access site. The catheter was advanced through the peel-away sheath. The sheath was then  removed. Final catheter positioning was confirmed with a fluoroscopic spot image. The access incision was closed with Dermabond applied to the incision. A Prolene retention suture was applied at the catheter exit site. Large volume 2L thoracentesis was performed through the new catheter utilizing gravity drainage bag. COMPLICATIONS: None. FINDINGS: The catheter was placed via the right lateral chest wall. Catheter course is towards the apex. Approximately 2liters of pleural fluid was able to be removed after catheter placement. The patient will be brought back in 10 to 14days to remove the temporary exit site retention suture after appropriate in-growth around the subcutaneous cuff of the catheter. IMPRESSION: Placement of permanent, tunneled right pleural drainage catheter via lateral approach. 2 liters of pleural fluid was removed today after catheter placement. Electronically Signed   By: Lucrezia Europe M.D.   On: 07/07/2016 16:26   Dg Chest Port 1 View  Result Date: 07/04/2016 CLINICAL DATA:  Port-A-Cath placement. EXAM: PORTABLE CHEST 1 VIEW COMPARISON:  06/30/2016 FINDINGS: Right-sided Port-A-Cath is new in the interval with the tip overlying the proximal SVC region. No evidence for right pneumothorax. Prominent interval progression of right pleural effusion now large in character. Left lung is clear. The cardio pericardial silhouette is enlarged. The visualized bony structures of the thorax are intact. IMPRESSION: No evidence for pneumothorax status post Port-A-Cath placement. Electronically Signed   By: Misty Stanley M.D.   On: 07/04/2016 12:13   Dg C-arm  1-60 Min-no Report  Result Date: 07/04/2016 Fluoroscopy was utilized by the requesting physician.  No radiographic interpretation.   US Thoracentesis Asp Pleural Space W/img Guide  Result Date: 06/30/2016 INDICATION: Small cell carcinoma of the RIGHT lung, recurrent RIGHT pleural effusion EXAM: ULTRASOUND GUIDED THERAPEUTIC RIGHT THORACENTESIS  MEDICATIONS: None. COMPLICATIONS: None immediate. PROCEDURE: Procedure, benefits, and risks of procedure were discussed with patient. Written informed consent for procedure was obtained. Time out protocol followed. Pleural effusion localized by ultrasound at the posterior RIGHT hemithorax. Skin prepped and draped in usual sterile fashion. Skin and soft tissues anesthetized with 13 mL of 1% lidocaine. 8 French thoracentesis catheter placed into the RIGHT pleural space. 1.5 L of serosanguineous RIGHT pleural fluid aspirated by syringe pump. Procedure terminated when the patient began to develop pain in the RIGHT chest. Procedure tolerated well by patient without immediate complication. FINDINGS: As above IMPRESSION: Successful ultrasound guided RIGHT thoracentesis yielding 1.5 L of pleural fluid. Electronically Signed   By: Lavonia Dana M.D.   On: 06/30/2016 14:24   US Thoracentesis Asp Pleural Space W/img Guide  Result Date: 06/16/2016 INDICATION: Small cell carcinoma of the RIGHT lung, recurrent RIGHT pleural effusion EXAM: ULTRASOUND GUIDED THERAPEUTIC RIGHT THORACENTESIS MEDICATIONS: None. COMPLICATIONS: None immediate. PROCEDURE: Procedure, benefits, and risks of procedure were discussed with patient. Written informed consent for procedure was obtained. Time out protocol followed. Pleural effusion localized by ultrasound at the posterior RIGHT hemithorax. Skin prepped and draped in usual sterile fashion. Skin and soft tissues anesthetized with 10 mL of 1% lidocaine. 8 French thoracentesis catheter placed into the RIGHT pleural space. 1.3 L of serosanguineous aspirated by syringe pump. Procedure tolerated well by patient without immediate complication. FINDINGS: As above IMPRESSION: Successful ultrasound guided RIGHT thoracentesis yielding 1.3 L of pleural fluid. Electronically Signed   By: Lavonia Dana M.D.   On: 06/16/2016 11:29     PERTINENT LAB RESULTS: CBC:  Recent Labs  07/05/16 1642 07/06/16 0500   WBC 5.4 5.1  HGB 10.8* 9.9*  HCT 32.4* 30.9*  PLT 330 289   CMET CMP     Component Value Date/Time   NA 137 07/06/2016 0500   K 3.9 07/06/2016 0500   CL 102 07/06/2016 0500   CO2 25 07/06/2016 0500   GLUCOSE 114 (H) 07/06/2016 0500   BUN 11 07/06/2016 0500   CREATININE 0.83 07/06/2016 0500   CALCIUM 9.0 07/06/2016 0500   PROT 5.6 (L) 07/06/2016 0500   ALBUMIN 2.7 (L) 07/06/2016 0500   AST 22 07/06/2016 0500   ALT 11 (L) 07/06/2016 0500   ALKPHOS 52 07/06/2016 0500   BILITOT 0.9 07/06/2016 0500   GFRNONAA >60 07/06/2016 0500   GFRAA >60 07/06/2016 0500    GFR Estimated Creatinine Clearance: 62.3 mL/min (by C-G formula based on SCr of 0.83 mg/dL). No results for input(s): LIPASE, AMYLASE in the last 72 hours.  Recent Labs  07/05/16 2230 07/06/16 0500 07/06/16 1012  TROPONINI <0.03 <0.03 <0.03   Invalid input(s): POCBNP  Recent Labs  07/05/16 2230  DDIMER 3.64*   No results for input(s): HGBA1C in the last 72 hours. No results for input(s): CHOL, HDL, LDLCALC, TRIG, CHOLHDL, LDLDIRECT in the last 72 hours.  Recent Labs  07/05/16 2230  TSH 11.449*   No results for input(s): VITAMINB12, FOLATE, FERRITIN, TIBC, IRON, RETICCTPCT in the last 72 hours. Coags:  Recent Labs  07/07/16 0003  INR 1.06   Microbiology: No results found for this or any previous visit (from the past 240  hour(s)).  FURTHER DISCHARGE INSTRUCTIONS:  Get Medicines reviewed and adjusted: Please take all your medications with you for your next visit with your Primary MD  Laboratory/radiological data: Please request your Primary MD to go over all hospital tests and procedure/radiological results at the follow up, please ask your Primary MD to get all Hospital records sent to his/her office.  In some cases, they will be blood work, cultures and biopsy results pending at the time of your discharge. Please request that your primary care M.D. goes through all the records of your hospital  data and follows up on these results.  Also Note the following: If you experience worsening of your admission symptoms, develop shortness of breath, life threatening emergency, suicidal or homicidal thoughts you must seek medical attention immediately by calling 911 or calling your MD immediately  if symptoms less severe.  You must read complete instructions/literature along with all the possible adverse reactions/side effects for all the Medicines you take and that have been prescribed to you. Take any new Medicines after you have completely understood and accpet all the possible adverse reactions/side effects.   Do not drive when taking Pain medications or sleeping medications (Benzodaizepines)  Do not take more than prescribed Pain, Sleep and Anxiety Medications. It is not advisable to combine anxiety,sleep and pain medications without talking with your primary care practitioner  Special Instructions: If you have smoked or chewed Tobacco  in the last 2 yrs please stop smoking, stop any regular Alcohol  and or any Recreational drug use.  Wear Seat belts while driving.  Please note: You were cared for by a hospitalist during your hospital stay. Once you are discharged, your primary care physician will handle any further medical issues. Please note that NO REFILLS for any discharge medications will be authorized once you are discharged, as it is imperative that you return to your primary care physician (or establish a relationship with a primary care physician if you do not have one) for your post hospital discharge needs so that they can reassess your need for medications and monitor your lab values.  Total Time spent coordinating discharge including counseling, education and face to face time equals 45 minutes.  SignedOren Binet 07/07/2016 5:21 PM

## 2016-07-07 NOTE — Procedures (Signed)
RIGHT PleurEx placement 2L R thoracentesis No complication No blood loss. See complete dictation in [facility].

## 2016-07-07 NOTE — Care Management Note (Addendum)
Case Management Note Marvetta Gibbons RN, BSN Unit 2W-Case Manager [phone]  Patient Details  Name: Robin Hernandez MRN: [id number] Date of Birth: 09-Mar-1941  Subjective/Objective:  Pt admitted with recurrent pl. Effusion- for pleurX cath placement today 2/26                  Action/Plan: PTA pt lived at home with spouse-- from [place]- will need [facility] for PleurX cath education and drainage- HHRN order placed- spoke with pt at bedside choice offered for [facility] services in [place]- per pt she has used [facility] in past- and wants to use them again- call made to [facility]- spoke with Beverlee Nims- however per Beverlee Nims they do not work with PleurX caths- and are unable to take referral- per [place] works with [facility]- call made to [facility]- and per their intake- they do work with PleurX caths- pt is ok with using [facility]- referral sent to [facility] for pleurX needs- orders and info faxed via epic- to [phone]- spoke with Mike Craze NP with [facility] [phone]) regarding drainage plans for PleurX- pt to f/u with Dr. Talbert Cage at the [facility] tomorrow 2/27 and they will decide on catheter drainage needs- [facility] will f/u with Dr. Talbert Cage on [facility] drainage orders. Per pt she has both her spouse and sister that can assist her at home with the drainage.  Per pt she also has a cancer doctor in [place]- Dr. Junius Roads.   Expected Discharge Date:    07/07/16              Expected Discharge Plan:  [place]  In-House Referral:     Discharge planning Services  CM Consult  Post Acute Care Choice:  Home Health Choice offered to:  Patient  DME Arranged:  Other see comment DME Agency:     HH Arranged:  RN [place] Agency:  [facility]  Status of Service:  Completed, signed off  If discussed at [place] of Stay Meetings, dates discussed:    Discharge Disposition: home with home  health   Additional Comments:  07/08/16- 1200- Post discharge- Marvetta Gibbons- pt discharged after hours on 2/26- bedside RN did send pt home with box of PleurX cath drainage kits- pleurX forms left for CM to fill out and send to [facility]- spoke with NP-Gretchen Renato Battles with [facility] today 2/27- regarding forms and need for signature- will fax forms to [place] to finish and get signature from MD- she will then take care of faxing/mailing completed form to [place]- fax info provided for [facility] for completed form to be sent- Faxed form to [facility] at - [phone]  Mahiya, [place], RN 07/07/2016, 2:22 PM

## 2016-07-07 NOTE — Telephone Encounter (Signed)
I received a call from Dr. Sloan Leiter, physician at [facility], alerting our team that the patient is currently hospitalized with shortness of breath.  MD reports that patient underwent subsequent thoracentesis; they are planning on placing a Pleurx catheter and discharging her home later today.   Dr. Talbert Cage made aware. We will reschedule her chemotherapy and follow-up visit with Dr. Talbert Cage to tomorrow. Her chemotherapy schedule has been adjusted as well. Nursing made aware.    Amy called patient to make her aware of the changes in her schedule.  She will be seen and considered for chemotherapy tomorrow, 07/08/16.    Mike Craze, NP [place] [phone]

## 2016-07-07 NOTE — Sedation Documentation (Signed)
pleurex drain in place, 2L drained from thora

## 2016-07-07 NOTE — Sedation Documentation (Signed)
MD doing thora procedure first, then will do pleurex drain

## 2016-07-07 NOTE — Progress Notes (Signed)
Pt to be discharged home with husband. IV and telemetry box removed. Pt received discharge instructions and all questions were answered. Pt was instructed to get her pain medication filled at her pharmacy after discharge; pt verbalized understanding. Pt is waiting for her ride to arrive to be discharged.  Grant Fontana BSN, RN

## 2016-07-08 ENCOUNTER — Encounter (HOSPITAL_COMMUNITY): Payer: Self-pay

## 2016-07-08 ENCOUNTER — Encounter (HOSPITAL_BASED_OUTPATIENT_CLINIC_OR_DEPARTMENT_OTHER): Payer: Medicare Other

## 2016-07-08 ENCOUNTER — Encounter (HOSPITAL_BASED_OUTPATIENT_CLINIC_OR_DEPARTMENT_OTHER): Payer: Medicare Other | Admitting: Oncology

## 2016-07-08 VITALS — BP 98/52 | HR 96 | Temp 98.4°F | Resp 18

## 2016-07-08 VITALS — BP 106/54 | HR 98 | Temp 97.7°F | Resp 20 | Wt 183.2 lb

## 2016-07-08 DIAGNOSIS — Z5111 Encounter for antineoplastic chemotherapy: Secondary | ICD-10-CM | POA: Diagnosis present

## 2016-07-08 DIAGNOSIS — C7A8 Other malignant neuroendocrine tumors: Secondary | ICD-10-CM

## 2016-07-08 DIAGNOSIS — C3491 Malignant neoplasm of unspecified part of right bronchus or lung: Secondary | ICD-10-CM | POA: Diagnosis not present

## 2016-07-08 DIAGNOSIS — J910 Malignant pleural effusion: Secondary | ICD-10-CM

## 2016-07-08 LAB — CBC WITH DIFFERENTIAL/PLATELET
Basophils Absolute: 0.1 K/uL (ref 0.0–0.1)
Basophils Relative: 1 %
Eosinophils Absolute: 0 K/uL (ref 0.0–0.7)
Eosinophils Relative: 1 %
HCT: 31.9 % — ABNORMAL LOW (ref 36.0–46.0)
Hemoglobin: 10.5 g/dL — ABNORMAL LOW (ref 12.0–15.0)
Lymphocytes Relative: 12 %
Lymphs Abs: 0.8 K/uL (ref 0.7–4.0)
MCH: 33 pg (ref 26.0–34.0)
MCHC: 32.9 g/dL (ref 30.0–36.0)
MCV: 100.3 fL — ABNORMAL HIGH (ref 78.0–100.0)
Monocytes Absolute: 0.7 K/uL (ref 0.1–1.0)
Monocytes Relative: 10 %
Neutro Abs: 5.1 K/uL (ref 1.7–7.7)
Neutrophils Relative %: 76 %
Platelets: 309 K/uL (ref 150–400)
RBC: 3.18 MIL/uL — ABNORMAL LOW (ref 3.87–5.11)
RDW: 17.2 % — ABNORMAL HIGH (ref 11.5–15.5)
WBC: 6.7 K/uL (ref 4.0–10.5)

## 2016-07-08 LAB — COMPREHENSIVE METABOLIC PANEL WITH GFR
ALT: 32 U/L (ref 14–54)
AST: 38 U/L (ref 15–41)
Albumin: 2.9 g/dL — ABNORMAL LOW (ref 3.5–5.0)
Alkaline Phosphatase: 68 U/L (ref 38–126)
Anion gap: 8 (ref 5–15)
BUN: 12 mg/dL (ref 6–20)
CO2: 28 mmol/L (ref 22–32)
Calcium: 9.1 mg/dL (ref 8.9–10.3)
Chloride: 97 mmol/L — ABNORMAL LOW (ref 101–111)
Creatinine, Ser: 0.83 mg/dL (ref 0.44–1.00)
GFR calc Af Amer: >60 mL/min (ref >60)
GFR calc non Af Amer: >60 mL/min (ref >60)
Glucose, Bld: 122 mg/dL — ABNORMAL HIGH (ref 65–99)
Potassium: 4 mmol/L (ref 3.5–5.1)
Sodium: 133 mmol/L — ABNORMAL LOW (ref 135–145)
Total Bilirubin: 1.2 mg/dL (ref 0.3–1.2)
Total Protein: 6.3 g/dL — ABNORMAL LOW (ref 6.5–8.1)

## 2016-07-08 LAB — PATHOLOGIST SMEAR REVIEW

## 2016-07-08 MED ORDER — CARBOPLATIN CHEMO INJECTION 600 MG/60ML
460.0000 mg | Freq: Once | INTRAVENOUS | Status: AC
  Administered 2016-07-08: 460 mg via INTRAVENOUS
  Filled 2016-07-08: qty 46

## 2016-07-08 MED ORDER — PALONOSETRON HCL INJECTION 0.25 MG/5ML
0.2500 mg | Freq: Once | INTRAVENOUS | Status: AC
  Administered 2016-07-08: 0.25 mg via INTRAVENOUS
  Filled 2016-07-08: qty 5

## 2016-07-08 MED ORDER — DEXAMETHASONE SODIUM PHOSPHATE 100 MG/10ML IJ SOLN: 10.0000 mg | Freq: Once | INTRAMUSCULAR | Status: DC

## 2016-07-08 MED ORDER — SODIUM CHLORIDE 0.9% FLUSH: 10.0000 mL | INTRAVENOUS | Status: DC | PRN

## 2016-07-08 MED ORDER — ETOPOSIDE CHEMO INJECTION 1 GM/50ML
100.0000 mg/m2 | Freq: Once | INTRAVENOUS | Status: AC
  Administered 2016-07-08: 190 mg via INTRAVENOUS
  Filled 2016-07-08: qty 9.5

## 2016-07-08 MED ORDER — DEXAMETHASONE SODIUM PHOSPHATE 10 MG/ML IJ SOLN
10.0000 mg | Freq: Once | INTRAMUSCULAR | Status: AC
  Administered 2016-07-08: 10 mg via INTRAVENOUS
  Filled 2016-07-08: qty 1

## 2016-07-08 MED ORDER — HEPARIN SOD (PORK) LOCK FLUSH 100 UNIT/ML IV SOLN
500.0000 [IU] | Freq: Once | INTRAVENOUS | Status: AC | PRN
Start: 2016-07-08 — End: 2016-07-08
  Administered 2016-07-08: 500 [IU]
  Filled 2016-07-08 (×2): qty 5

## 2016-07-08 MED ORDER — SODIUM CHLORIDE 0.9 % IV SOLN
Freq: Once | INTRAVENOUS | Status: AC
  Administered 2016-07-08: 11:00:00 via INTRAVENOUS

## 2016-07-08 NOTE — Progress Notes (Signed)
[name]  PROGRESS NOTE  Patient Care Team: No Pcp Per Patient as PCP - General (General Practice)  CHIEF COMPLAINTS/PURPOSE OF CONSULTATION:     Small cell carcinoma of lung, right ([place])   06/12/2016 Initial Diagnosis    Small cell carcinoma of lung, right ([place])     06/30/2016 Procedure    Successful ultrasound guided RIGHT thoracentesis yielding 1.5 L of pleural fluid.       HISTORY OF PRESENTING ILLNESS:  Robin Hernandez 76 y.o. female is here because of referral by Laurena Slimmer, MD for small-cell lung cancer for chemotherapy.  As per Dr. Hali Marry, MD note on 06/09/2016: " Robin Hernandez is a pleasant 76 year old female with past medical history of hypertension, hyper lipidemia, osteoporosis presented initially with complaints of generalized fatigue came appropriately worse in the past month. In the ED she was found to have elevated pulse 154 which was subsequently found to be atrial flutter. The chest x-ray was found to have a pleural effusion and upon further investigation with CT add large pleural effusion on the right was found. A thoracentesis was performed during hospital stay which removed 1.8 mL of bloody fluid. 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 A mass was also identified upon CT scan and heme-oncology was consulted and I saw the patient while she was in the hospital. She underwent CT abd/pelvis which did not reveal metastasis and [facility] Edelman MRI was also negative for metastasis. She came to outpatient clinic on Monday for day 1 carboplatin AUC 5 and etoposide 100 mg/m2 day 1. Due to national shortage, she has no been able to receive etoposide day 2 and day 3."    Patient presents today accompanied by her husband. She is here for temporary treatment as Dr. Hali Marry clinic currently has a shortage of her chemotherapy medication.  Patient was last admitted to the hospital on 07/05/2016 for SOB, She  was found to have a right pleural effusion. Underwent thoracocentesis of  2 L of pleural  fluid from right chest with placement of a right Pleurex catheter.  She notes she doesn't know how to drain her pleural effusion and was not shown how.  She has not yet started cycle 2 of chemotherapy treatment with cisplatin+etoposide.  She states her right shoulder blade is so sore that every time she picks up her arm it hurts. She doesn't know what is causing the soreness. She notices that when she coughs it really hurts. She notes she took a morphine this morning and it helped.  Patient states she has a headache, is fatigued, and her energy level is less than 1. She note she is barely functioning. She notes the headache started 2 weeks ago, but is intermittent.   MEDICAL HISTORY:  Past Medical History:  Diagnosis Date  . Cancer ([place])    lung  . Hypercholesterolemia   . Hypertension     SURGICAL HISTORY: Past Surgical History:  Procedure Laterality Date  . BREAST SURGERY    . CATARACT EXTRACTION Bilateral    2011-2012  . CHOLECYSTECTOMY  1982  . IR GENERIC HISTORICAL  07/07/2016   IR GUIDED DRAIN W CATHETER PLACEMENT 07/07/2016 Arne Cleveland, MD [facility]-INTERV RAD  . PORTACATH PLACEMENT Right 07/04/2016   Procedure: INSERTION PORT-A-CATH;  Surgeon: Aviva Signs, MD;  Location: [facility];  Service: General;  Laterality: Right;    SOCIAL HISTORY: Social History   Social History  . Marital status: Married  Spouse name: N/A  . Number of children: N/A  . Years of education: N/A   Occupational History  . Not on file.   Social History Main Topics  . Smoking status: Former Smoker    Packs/day: 0.75    Years: 55.00    Types: Cigarettes    Quit date: 05/25/2016  . Smokeless tobacco: Former Systems developer  . Alcohol use No  . Drug use: No  . Sexual activity: Not Currently   Other Topics Concern  . Not on file   Social History Narrative  . No narrative on file   Social Hx: Current everyday  smoker. Been smoking for 40 years, 1 pack per day.  Patient is married. Patient lives with spouse. Denies alcohol use. Denies drug use. Patient is retired. Has at least 3 grandchildren and 1 son.   FAMILY HISTORY: Family History  Problem Relation Age of Onset  . Parkinson's disease Mother   . Heart attack Mother   . Breast cancer Sister   . Cancer Brother   . Cancer Maternal Aunt   . Cancer Maternal Uncle   . Stroke Paternal Aunt   . Stroke Paternal Uncle   . Heart attack Paternal Uncle   . CAD Maternal Grandmother   . Heart attack Maternal Grandmother     ALLERGIES:  is allergic to codeine; prednisone; streptomycin; and tape.  MEDICATIONS:  Current Outpatient Prescriptions  Medication Sig Dispense Refill  . acetaminophen (TYLENOL) 500 MG tablet Take 500 mg by mouth every 6 (six) hours as needed for mild pain, moderate pain or headache.    . albuterol (PROVENTIL HFA;VENTOLIN HFA) 108 (90 Base) MCG/ACT inhaler Inhale 2 puffs into the lungs every 4 (four) hours as needed for wheezing or shortness of breath.    . diclofenac (VOLTAREN) 75 MG EC tablet Take 75 mg by mouth at bedtime.     . lidocaine-prilocaine (EMLA) cream Apply to affected area once (Patient taking differently: Apply 1 application topically daily as needed (prior to accessing port). ) 30 g 3  . metoprolol tartrate (LOPRESSOR) 25 MG tablet Take 25 mg by mouth at bedtime.     . ondansetron (ZOFRAN) 8 MG tablet Take 1 tablet (8 mg total) by mouth 2 (two) times daily as needed for refractory nausea / vomiting. 30 tablet 1  . oxyCODONE-acetaminophen (ROXICET) 5-325 MG tablet Take 1 tablet by mouth every 6 (six) hours as needed for severe pain. 25 tablet 0  . prochlorperazine (COMPAZINE) 10 MG tablet Take 1 tablet (10 mg total) by mouth every 6 (six) hours as needed (Nausea or vomiting). (Patient taking differently: Take 5-10 mg by mouth every 6 (six) hours as needed (Nausea or vomiting). ) 30 tablet 1  . simvastatin  (ZOCOR) 40 MG tablet Take 40 mg by mouth at bedtime.      No current facility-administered medications for this visit.     Review of Systems  Constitutional: Positive for malaise/fatigue.       Energy level is less than 1. She notes she is barely functioning  HENT: Negative.   Eyes: Negative.   Respiratory: Negative.   Cardiovascular: Negative.   Gastrointestinal: Negative.   Genitourinary: Negative.   Musculoskeletal: Negative.        Right shoulder blade is sore.  Skin: Negative.   Neurological: Positive for headaches (intermittently for past 2 weeks).  Endo/Heme/Allergies: Negative.   Psychiatric/Behavioral: Negative.   All other systems reviewed and are negative.  14 point ROS was done and is otherwise  as detailed above or in HPI   PHYSICAL EXAMINATION: ECOG PERFORMANCE STATUS: 1 - Symptomatic but completely ambulatory  Vitals:   07/08/16 0906  BP: (!) 106/54  Pulse: 98  Resp: 20  Temp: 97.7 F (36.5 C)   Filed Weights   07/08/16 0906  Weight: 183 lb 3.2 oz (83.1 kg)     Physical Exam  Constitutional: She is oriented to person, place, and time and well-developed, well-nourished, and in no distress.  Physical exam was performed in chair as she notes she will not be able to lay back on the table.  HENT:  Head: Normocephalic and atraumatic.  Eyes: Conjunctivae and EOM are normal. Pupils are equal, round, and reactive to light.  Neck: Normal range of motion. Neck supple.  Cardiovascular: Normal rate, regular rhythm and normal heart sounds.   Pulmonary/Chest: Effort normal and breath sounds normal.  Abdominal: Soft. Bowel sounds are normal.  Musculoskeletal: Normal range of motion.  Neurological: She is alert and oriented to person, place, and time. Gait normal.  Skin: Skin is warm and dry.  Nursing note and vitals reviewed.    LABORATORY DATA:  I have reviewed the data as listed Lab Results  Component Value Date   WBC 5.1 07/06/2016   HGB 9.9 (L)  07/06/2016   HCT 30.9 (L) 07/06/2016   MCV 100.0 07/06/2016   PLT 289 07/06/2016   CMP     Component Value Date/Time   NA 137 07/06/2016 0500   K 3.9 07/06/2016 0500   CL 102 07/06/2016 0500   CO2 25 07/06/2016 0500   GLUCOSE 114 (H) 07/06/2016 0500   BUN 11 07/06/2016 0500   CREATININE 0.83 07/06/2016 0500   CALCIUM 9.0 07/06/2016 0500   PROT 5.6 (L) 07/06/2016 0500   ALBUMIN 2.7 (L) 07/06/2016 0500   AST 22 07/06/2016 0500   ALT 11 (L) 07/06/2016 0500   ALKPHOS 52 07/06/2016 0500   BILITOT 0.9 07/06/2016 0500   GFRNONAA >60 07/06/2016 0500   GFRAA >60 07/06/2016 0500     RADIOGRAPHIC STUDIES: I have personally reviewed the radiological images as listed and agreed with the findings in the report.  Study Result  Thoracentesis surgical pleural eff 06/02/16  Impression: Status post ultrasound guided right thoracentesis.    CHEST  2 VIEW 07/05/2016   IMPRESSION: 1. Increase in volume of right pleural effusion with worsening aeration to the right lung.   CT ANGIOGRAPHY CHEST WITH CONTRAST 07/06/2016  IMPRESSION: 1. No CT evidence of central pulmonary artery embolus. 2. Subcarinal mass/adenopathy with encasement of the right bronchus intermedius and complete occlusion of the right lower lobe bronchus. 3. Large right pleural effusion most consistent with malignant ascites. Areas of nodular thickening along the right pleura compatible with pleural-based metastatic implant. 4. Near complete consolidative changes of the right lung with only a small aerated portion of the lung in the right upper lobe. The left lung remains clear. 5. Mediastinal, right cardiophrenic, and right supraclavicular adenopathy. Mildly enlarged rounded lymph node in the left axilla. 6. Area of heterogeneity in the posterior aspect of the right second rib may be chronic or represent metastatic disease. This can be further evaluated with bone scan if clinically indicated.   INSERTION OF  TUNNELED PLEURAL DRAINAGE CATHETER 07/07/2016  IMPRESSION: Placement of permanent, tunneled right pleural drainage catheter via lateral approach. 2 liters of pleural fluid was removed today after catheter placement.    ASSESSMENT & PLAN:  Cancer Staging Small cell carcinoma of lung, right ([place])  Staging form: Lung, AJCC 8th Edition - Clinical: Stage IVA (cTX, cNX, cM1a) - Signed by Twana First, MD on 06/12/2016  No problem-specific Assessment & Plan notes found for this encounter. 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  As per Dr. Hali Marry, MD plan on 06/09/16: "1. Small cell neuroendocrine tumor of lung. The patient's pleural fluid showed small cell neuroendocrine tumor of the lung with high Ki67. MRI of the brain was negative and CT abdomen was negative. 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 The son wanted to know the type of tumor, treatment, prognosis. I informed them that lung cancer is highly aggressive, most common cancer causing mortality, of 2 types small cell and non-small cell. Of these 2, small cell is more aggressive. Prognosis is about 12 months. The prognosis is with treatment. Our usual treatment status gene walls giving combination of carboplatin and etoposide every 21 days for 4-6 cycles and follow the patient with surveillance scans. The patient and the family verbalized understanding. She tolerated day 1 of cycle 1 pretty well. We have not been able to give day 2 and day 3 because the top side is National a short and we have not received it through the pharmacy." I have discussed patient's case over the phone with Dr. Junius Roads. She will be with [place] temporarily until Dr. Junius Roads receives the chemotherapy medicine in stock.   Cycle 2 day 1 chemotherapy planned for today.  Set up home health for drainage of Pleurex catheter.   I have advised patient  that should her headaches persist she is to let me know so we can repeat her MRI of the brain to rule out brain mets.   RTC with her cycle 3 of chemo on 07/27/2016.   This document serves as a record of services personally performed by Twana First, MD. It was created on her behalf by Shirlean Mylar, a trained medical scribe. The creation of this record is based on the scribe's personal observations and the provider's statements to them. This document has been checked and approved by the attending provider.  I have reviewed the above documentation for accuracy and completeness and I agree with the above.  This note was electronically signed.    [employer]  07/08/2016 9:15 AM

## 2016-07-08 NOTE — Patient Instructions (Signed)
[facility] Discharge Instructions for Patients Receiving Chemotherapy   Beginning January 23rd 2017 lab work for the [facility] will be done in the  Main lab at [facility] on 1st floor. If you have a lab appointment with the [facility] please come in thru the  Main Entrance and check in at the main information desk   Today you received the following chemotherapy agents:  Carboplatin and etoposide  To help prevent nausea and vomiting after your treatment, we encourage you to take your nausea medication as prescribed. If you develop nausea and vomiting, or diarrhea that is not controlled by your medication, call the clinic.  The clinic phone number is [phone]. Office hours are Monday-Friday 8:30am-5:00pm.  BELOW ARE SYMPTOMS THAT SHOULD BE REPORTED IMMEDIATELY:  *FEVER GREATER THAN 101.0 F  *CHILLS WITH OR WITHOUT FEVER  NAUSEA AND VOMITING THAT IS NOT CONTROLLED WITH YOUR NAUSEA MEDICATION  *UNUSUAL SHORTNESS OF BREATH  *UNUSUAL BRUISING OR BLEEDING  TENDERNESS IN MOUTH AND THROAT WITH OR WITHOUT PRESENCE OF ULCERS  *URINARY PROBLEMS  *BOWEL PROBLEMS  UNUSUAL RASH Items with * indicate a potential emergency and should be followed up as soon as possible. If you have an emergency after office hours please contact your primary care physician or go to the nearest emergency department.  Please call the clinic during office hours if you have any questions or concerns.   You may also contact the Patient Navigator at [phone] should you have any questions or need assistance in obtaining follow up care.      Resources For Cancer Patients and their Caregivers ? American Cancer Society: Can assist with transportation, wigs, general needs, runs Look Good Feel Better.        9258853030 ? Cancer Care: Provides financial assistance, online support groups, medication/co-pay assistance.  1-800-813-HOPE 603-876-8568) ? [place] Assists [place] cancer patients and their families through emotional , educational and financial support.  (314)129-5722 ? [place] DSS Where to apply for food stamps, Medicaid and utility assistance. 303-769-4211 ? RCATS: Transportation to medical appointments. 506-487-5477 ? Social Security Administration: May apply for disability if have a Stage IV cancer. ([phone] 711-7268 (856)729-4334 ? [employer], Disability and Transit Services: Assists with nutrition, care and transit needs. 660-217-0631

## 2016-07-08 NOTE — Care Management (Signed)
Post Discharge- PleurX Cath needs- f/u call made to [facility] for further needs regarding PleurX Cath forms-  07/08/16- 1200- Post discharge- Marvetta Gibbons- pt discharged after hours on 2/26- bedside RN did send pt home with box of PleurX cath drainage kits- pleurX forms left for CM to fill out and send to [employer]- spoke with NP-Gretchen Renato Battles with [facility] today 2/27- regarding forms and need for signature- will fax forms to [place] to finish and get signature from MD- she will then take care of faxing/mailing completed form to [facility]- fax info provided for [employer] for completed form to be sent- Faxed form to [facility] at - [phone]

## 2016-07-08 NOTE — Patient Instructions (Addendum)
[place] at [facility] Discharge Instructions  RECOMMENDATIONS MADE BY THE CONSULTANT AND ANY TEST RESULTS WILL BE SENT TO YOUR REFERRING PHYSICIAN.  You were seen today by Dr. Twana First We will get you referred to home health for draining of the pleurex catheter Follow up as scheduled See Amy up front for appointments    Thank you for choosing [place] at [facility] to provide your oncology and hematology care.  To afford each patient quality time with our provider, please arrive at least 15 minutes before your scheduled appointment time.    If you have a lab appointment with the [facility] please come in thru the  Main Entrance and check in at the main information desk  You need to re-schedule your appointment should you arrive 10 or more minutes late.  We strive to give you quality time with our providers, and arriving late affects you and other patients whose appointments are after yours.  Also, if you no show three or more times for appointments you may be dismissed from the clinic at the providers discretion.     Again, thank you for choosing [facility].  Our hope is that these requests will decrease the amount of time that you wait before being seen by our physicians.       _____________________________________________________________  Should you have questions after your visit to [facility], please contact our office at [phone] between the hours of 8:30 a.m. and 4:30 p.m.  Voicemails left after 4:30 p.m. will not be returned until the following business day.  For prescription refill requests, have your pharmacy contact our office.       Resources For Cancer Patients and their Caregivers ? American Cancer Society: Can assist with transportation, wigs, general needs, runs Look Good Feel Better.        (610)330-7797 ? Cancer Care: Provides financial assistance, online support groups,  medication/co-pay assistance.  1-800-813-HOPE 929 585 3849) ? [place] Assists [place] cancer patients and their families through emotional , educational and financial support.  928 452 3659 ? [place] DSS Where to apply for food stamps, Medicaid and utility assistance. (517) 703-9916 ? RCATS: Transportation to medical appointments. (431) 669-3875 ? Social Security Administration: May apply for disability if have a Stage IV cancer. (276) 425-5650 573-837-9794 ? [employer], Disability and Transit Services: Assists with nutrition, care and transit needs. [place] Support Programs: '@10RELATIVEDAYS'$ @ > Cancer Support Group  2nd Tuesday of the month 1pm-2pm, Journey Room  > Creative Journey  3rd Tuesday of the month 1130am-1pm, Journey Room  > Look Good Feel Better  1st Wednesday of the month 10am-12 noon, Journey Room (Call [place] to register [phone])

## 2016-07-08 NOTE — Progress Notes (Signed)
Tolerated infusions w/o adverse reaction.  Alert, in no distress.  VSS.  Discharged ambulatory in c/o spouse.  

## 2016-07-09 ENCOUNTER — Encounter (HOSPITAL_BASED_OUTPATIENT_CLINIC_OR_DEPARTMENT_OTHER): Payer: Medicare Other

## 2016-07-09 ENCOUNTER — Encounter (HOSPITAL_COMMUNITY): Payer: Self-pay

## 2016-07-09 VITALS — BP 93/49 | HR 82 | Temp 98.1°F | Resp 18

## 2016-07-09 DIAGNOSIS — C7A8 Other malignant neuroendocrine tumors: Secondary | ICD-10-CM

## 2016-07-09 DIAGNOSIS — Z5111 Encounter for antineoplastic chemotherapy: Secondary | ICD-10-CM

## 2016-07-09 DIAGNOSIS — C3491 Malignant neoplasm of unspecified part of right bronchus or lung: Secondary | ICD-10-CM

## 2016-07-09 MED ORDER — SODIUM CHLORIDE 0.9 % IV SOLN: 10.0000 mg | Freq: Once | INTRAVENOUS | Status: DC

## 2016-07-09 MED ORDER — HEPARIN SOD (PORK) LOCK FLUSH 100 UNIT/ML IV SOLN
INTRAVENOUS | Status: AC
  Filled 2016-07-09: qty 5

## 2016-07-09 MED ORDER — SODIUM CHLORIDE 0.9 % IV SOLN
Freq: Once | INTRAVENOUS | Status: AC
  Administered 2016-07-09: 10:00:00 via INTRAVENOUS

## 2016-07-09 MED ORDER — HEPARIN SOD (PORK) LOCK FLUSH 100 UNIT/ML IV SOLN
500.0000 [IU] | Freq: Once | INTRAVENOUS | Status: AC | PRN
  Administered 2016-07-09: 500 [IU]

## 2016-07-09 MED ORDER — DEXAMETHASONE SODIUM PHOSPHATE 10 MG/ML IJ SOLN
10.0000 mg | Freq: Once | INTRAMUSCULAR | Status: AC
  Administered 2016-07-09: 10 mg via INTRAVENOUS

## 2016-07-09 MED ORDER — ETOPOSIDE CHEMO INJECTION 1 GM/50ML
100.0000 mg/m2 | Freq: Once | INTRAVENOUS | Status: AC
  Administered 2016-07-09: 190 mg via INTRAVENOUS
  Filled 2016-07-09: qty 9.5

## 2016-07-09 MED ORDER — DEXAMETHASONE SODIUM PHOSPHATE 10 MG/ML IJ SOLN
INTRAMUSCULAR | Status: AC
  Filled 2016-07-09: qty 1

## 2016-07-09 MED ORDER — SODIUM CHLORIDE 0.9% FLUSH: 10.0000 mL | INTRAVENOUS | Status: DC | PRN

## 2016-07-09 NOTE — Patient Instructions (Signed)
[facility] Discharge Instructions for Patients Receiving Chemotherapy   Beginning January 23rd 2017 lab work for the [facility] will be done in the  Main lab at [facility] on 1st floor. If you have a lab appointment with the [facility] please come in thru the  Main Entrance and check in at the main information desk   Today you received the following chemotherapy agent: Etoposide.     If you develop nausea and vomiting, or diarrhea that is not controlled by your medication, call the clinic.  The clinic phone number is [phone]. Office hours are Monday-Friday 8:30am-5:00pm.  BELOW ARE SYMPTOMS THAT SHOULD BE REPORTED IMMEDIATELY:  *FEVER GREATER THAN 101.0 F  *CHILLS WITH OR WITHOUT FEVER  NAUSEA AND VOMITING THAT IS NOT CONTROLLED WITH YOUR NAUSEA MEDICATION  *UNUSUAL SHORTNESS OF BREATH  *UNUSUAL BRUISING OR BLEEDING  TENDERNESS IN MOUTH AND THROAT WITH OR WITHOUT PRESENCE OF ULCERS  *URINARY PROBLEMS  *BOWEL PROBLEMS  UNUSUAL RASH Items with * indicate a potential emergency and should be followed up as soon as possible. If you have an emergency after office hours please contact your primary care physician or go to the nearest emergency department.  Please call the clinic during office hours if you have any questions or concerns.   You may also contact the Patient Navigator at [phone] should you have any questions or need assistance in obtaining follow up care.      Resources For Cancer Patients and their Caregivers ? American Cancer Society: Can assist with transportation, wigs, general needs, runs Look Good Feel Better.        517-450-9334 ? Cancer Care: Provides financial assistance, online support groups, medication/co-pay assistance.  1-800-813-HOPE (657)876-4954) ? [place] Assists [place] cancer patients and their families through emotional , educational and financial support.   2262820199 ? [place] DSS Where to apply for food stamps, Medicaid and utility assistance. (325)705-3550 ? RCATS: Transportation to medical appointments. 769-314-3140 ? Social Security Administration: May apply for disability if have a Stage IV cancer. [phone] ? [employer], Disability and Transit Services: Assists with nutrition, care and transit needs. [phone]

## 2016-07-09 NOTE — Progress Notes (Signed)
Patient tolerated well.  VSS and patient stable.  Patient wheeled out of the clinic via wheelchair r/t patient sleepy after taking pain medication.

## 2016-07-10 ENCOUNTER — Encounter (HOSPITAL_COMMUNITY): Payer: Self-pay | Admitting: General Surgery

## 2016-07-10 ENCOUNTER — Encounter (HOSPITAL_COMMUNITY): Payer: Medicare Other | Attending: Oncology

## 2016-07-10 VITALS — BP 96/45 | HR 75 | Temp 97.4°F | Resp 18

## 2016-07-10 DIAGNOSIS — Z8249 Family history of ischemic heart disease and other diseases of the circulatory system: Secondary | ICD-10-CM | POA: Insufficient documentation

## 2016-07-10 DIAGNOSIS — Z79899 Other long term (current) drug therapy: Secondary | ICD-10-CM | POA: Insufficient documentation

## 2016-07-10 DIAGNOSIS — J910 Malignant pleural effusion: Secondary | ICD-10-CM | POA: Insufficient documentation

## 2016-07-10 DIAGNOSIS — R05 Cough: Secondary | ICD-10-CM | POA: Insufficient documentation

## 2016-07-10 DIAGNOSIS — E7800 Pure hypercholesterolemia, unspecified: Secondary | ICD-10-CM | POA: Insufficient documentation

## 2016-07-10 DIAGNOSIS — Z888 Allergy status to other drugs, medicaments and biological substances status: Secondary | ICD-10-CM | POA: Insufficient documentation

## 2016-07-10 DIAGNOSIS — R51 Headache: Secondary | ICD-10-CM | POA: Insufficient documentation

## 2016-07-10 DIAGNOSIS — Z823 Family history of stroke: Secondary | ICD-10-CM | POA: Insufficient documentation

## 2016-07-10 DIAGNOSIS — Z5111 Encounter for antineoplastic chemotherapy: Secondary | ICD-10-CM | POA: Diagnosis present

## 2016-07-10 DIAGNOSIS — R0602 Shortness of breath: Secondary | ICD-10-CM | POA: Insufficient documentation

## 2016-07-10 DIAGNOSIS — I10 Essential (primary) hypertension: Secondary | ICD-10-CM | POA: Insufficient documentation

## 2016-07-10 DIAGNOSIS — C7A8 Other malignant neuroendocrine tumors: Secondary | ICD-10-CM

## 2016-07-10 DIAGNOSIS — Z9221 Personal history of antineoplastic chemotherapy: Secondary | ICD-10-CM | POA: Insufficient documentation

## 2016-07-10 DIAGNOSIS — Z885 Allergy status to narcotic agent status: Secondary | ICD-10-CM | POA: Insufficient documentation

## 2016-07-10 DIAGNOSIS — Z9889 Other specified postprocedural states: Secondary | ICD-10-CM | POA: Insufficient documentation

## 2016-07-10 DIAGNOSIS — Z9049 Acquired absence of other specified parts of digestive tract: Secondary | ICD-10-CM | POA: Insufficient documentation

## 2016-07-10 DIAGNOSIS — Z820 Family history of epilepsy and other diseases of the nervous system: Secondary | ICD-10-CM | POA: Insufficient documentation

## 2016-07-10 DIAGNOSIS — F17210 Nicotine dependence, cigarettes, uncomplicated: Secondary | ICD-10-CM | POA: Insufficient documentation

## 2016-07-10 DIAGNOSIS — D3A8 Other benign neuroendocrine tumors: Secondary | ICD-10-CM | POA: Insufficient documentation

## 2016-07-10 DIAGNOSIS — C3491 Malignant neoplasm of unspecified part of right bronchus or lung: Secondary | ICD-10-CM | POA: Insufficient documentation

## 2016-07-10 DIAGNOSIS — I4892 Unspecified atrial flutter: Secondary | ICD-10-CM | POA: Insufficient documentation

## 2016-07-10 MED ORDER — HEPARIN SOD (PORK) LOCK FLUSH 100 UNIT/ML IV SOLN
500.0000 [IU] | Freq: Once | INTRAVENOUS | Status: AC | PRN
  Administered 2016-07-10: 500 [IU]
  Filled 2016-07-10: qty 5

## 2016-07-10 MED ORDER — SODIUM CHLORIDE 0.9 % IV SOLN: 10.0000 mg | Freq: Once | INTRAVENOUS | Status: DC

## 2016-07-10 MED ORDER — DEXAMETHASONE SODIUM PHOSPHATE 10 MG/ML IJ SOLN
10.0000 mg | Freq: Once | INTRAMUSCULAR | Status: AC
  Administered 2016-07-10: 10 mg via INTRAVENOUS
  Filled 2016-07-10: qty 1

## 2016-07-10 MED ORDER — SODIUM CHLORIDE 0.9 % IV SOLN
100.0000 mg/m2 | Freq: Once | INTRAVENOUS | Status: AC
  Administered 2016-07-10: 190 mg via INTRAVENOUS
  Filled 2016-07-10: qty 9.5

## 2016-07-10 MED ORDER — SODIUM CHLORIDE 0.9 % IV SOLN
Freq: Once | INTRAVENOUS | Status: AC
  Administered 2016-07-10: 11:00:00 via INTRAVENOUS

## 2016-07-10 MED ORDER — SODIUM CHLORIDE 0.9% FLUSH
10.0000 mL | INTRAVENOUS | Status: DC | PRN
  Administered 2016-07-10: 10 mL
  Filled 2016-07-10: qty 10

## 2016-07-10 NOTE — Progress Notes (Signed)
Robin Hernandez tolerated chemo tx well without incident.Pt c/o shoulder pain upon admission to clinic and took her own pain medication from home. Pt slept through most of her chemo tx with pain relieved. VSS upon discharge. Pt discharged via wheelchair in satisfactory condition accompanied by her husband

## 2016-07-10 NOTE — Patient Instructions (Signed)
[facility] Discharge Instructions for Patients Receiving Chemotherapy   Beginning January 23rd 2017 lab work for the Cancer Center will be done in the  Main lab at [facility] on 1st floor. If you have a lab appointment with the Cancer Center please come in thru the  Main Entrance and check in at the main information desk   Today you received the following chemotherapy agents Etoposide. Follow-up as scheduled. Call clinic for any questions or concerns  To help prevent nausea and vomiting after your treatment, we encourage you to take your nausea medication   If you develop nausea and vomiting, or diarrhea that is not controlled by your medication, call the clinic.  The clinic phone number is [phone]. Office hours are Monday-Friday 8:30am-5:00pm.  BELOW ARE SYMPTOMS THAT SHOULD BE REPORTED IMMEDIATELY:  *FEVER GREATER THAN 101.0 F  *CHILLS WITH OR WITHOUT FEVER  NAUSEA AND VOMITING THAT IS NOT CONTROLLED WITH YOUR NAUSEA MEDICATION  *UNUSUAL SHORTNESS OF BREATH  *UNUSUAL BRUISING OR BLEEDING  TENDERNESS IN MOUTH AND THROAT WITH OR WITHOUT PRESENCE OF ULCERS  *URINARY PROBLEMS  *BOWEL PROBLEMS  UNUSUAL RASH Items with * indicate a potential emergency and should be followed up as soon as possible. If you have an emergency after office hours please contact your primary care physician or go to the nearest emergency department.  Please call the clinic during office hours if you have any questions or concerns.   You may also contact the Patient Navigator at [phone] should you have any questions or need assistance in obtaining follow up care.      Resources For Cancer Patients and their Caregivers ? American Cancer Society: Can assist with transportation, wigs, general needs, runs Look Good Feel Better.        1-888-227-6333 ? Cancer Care: Provides financial assistance, online support groups, medication/co-pay assistance.  1-800-813-HOPE  (4673) ? [facility] Assists [place] cancer patients and their families through emotional , educational and financial support.  336-427-4357 ? [place] DSS Where to apply for food stamps, Medicaid and utility assistance. 336-342-1394 ? RCATS: Transportation to medical appointments. 336-347-2287 ? Social Security Administration: May apply for disability if have a Stage IV cancer. 336-342-7796 1-800-772-1213 ? [place] Aging, Disability and Transit Services: Assists with nutrition, care and transit needs. 336-349-2343         

## 2016-07-11 ENCOUNTER — Other Ambulatory Visit (HOSPITAL_COMMUNITY): Payer: Self-pay

## 2016-07-11 ENCOUNTER — Encounter (HOSPITAL_BASED_OUTPATIENT_CLINIC_OR_DEPARTMENT_OTHER): Payer: Medicare Other

## 2016-07-11 ENCOUNTER — Telehealth (HOSPITAL_COMMUNITY): Payer: Self-pay

## 2016-07-11 VITALS — BP 89/51 | HR 76 | Resp 18

## 2016-07-11 DIAGNOSIS — Z5189 Encounter for other specified aftercare: Secondary | ICD-10-CM

## 2016-07-11 DIAGNOSIS — C7A8 Other malignant neuroendocrine tumors: Secondary | ICD-10-CM

## 2016-07-11 DIAGNOSIS — C3491 Malignant neoplasm of unspecified part of right bronchus or lung: Secondary | ICD-10-CM

## 2016-07-11 DIAGNOSIS — J90 Pleural effusion, not elsewhere classified: Secondary | ICD-10-CM

## 2016-07-11 MED ORDER — PEGFILGRASTIM INJECTION 6 MG/0.6ML ~~LOC~~
6.0000 mg | PREFILLED_SYRINGE | Freq: Once | SUBCUTANEOUS | Status: AC
  Administered 2016-07-11: 6 mg via SUBCUTANEOUS

## 2016-07-11 MED ORDER — PEGFILGRASTIM INJECTION 6 MG/0.6ML ~~LOC~~
PREFILLED_SYRINGE | SUBCUTANEOUS | Status: AC
  Filled 2016-07-11: qty 0.6

## 2016-07-11 NOTE — Telephone Encounter (Signed)
I called patient to let her know about the right shoulder x-ray. She didn't let me finish explaining because she said she was going back to [place] to get treatment. She states they told her they had gotten her medicine in. It is much [place] to home for patient. She expressed her thanks.

## 2016-07-11 NOTE — Progress Notes (Signed)
Robin Hernandez presents today for injection per MD orders. Neulasta '6mg'$  administered SQ in left Abdomen. Administration without incident. Patient tolerated well.

## 2016-07-11 NOTE — Patient Instructions (Signed)
[place] at [facility] Discharge Instructions  RECOMMENDATIONS MADE BY THE CONSULTANT AND ANY TEST RESULTS WILL BE SENT TO YOUR REFERRING PHYSICIAN.  Neulasta 6 mg injection given as ordered.   Thank you for choosing [place] at [facility] to provide your oncology and hematology care.  To afford each patient quality time with our provider, please arrive at least 15 minutes before your scheduled appointment time.    If you have a lab appointment with the [facility] please come in thru the  Main Entrance and check in at the main information desk  You need to re-schedule your appointment should you arrive 10 or more minutes late.  We strive to give you quality time with our providers, and arriving late affects you and other patients whose appointments are after yours.  Also, if you no show three or more times for appointments you may be dismissed from the clinic at the providers discretion.     Again, thank you for choosing [facility].  Our hope is that these requests will decrease the amount of time that you wait before being seen by our physicians.       _____________________________________________________________  Should you have questions after your visit to [facility], please contact our office at [phone] between the hours of 8:30 a.m. and 4:30 p.m.  Voicemails left after 4:30 p.m. will not be returned until the following business day.  For prescription refill requests, have your pharmacy contact our office.       Resources For Cancer Patients and their Caregivers ? American Cancer Society: Can assist with transportation, wigs, general needs, runs Look Good Feel Better.        (918)807-9169 ? Cancer Care: Provides financial assistance, online support groups, medication/co-pay assistance.  1-800-813-HOPE 567-331-9817) ? [facility] Assists [place] cancer patients and their  families through emotional , educational and financial support.  419 443 3892 ? [place] DSS Where to apply for food stamps, Medicaid and utility assistance. 864-618-5565 ? [employer]: Transportation to medical appointments. (319) 851-5441 ? Social Security Administration: May apply for disability if have a Stage IV cancer. 803-284-6862 (661)558-5854 ? [employer], Disability and Transit Services: Assists with nutrition, care and transit needs. [place] Support Programs: '@10RELATIVEDAYS'$ @ > Cancer Support Group  2nd Tuesday of the month 1pm-2pm, Journey Room  > Creative Journey  3rd Tuesday of the month 1130am-1pm, Journey Room  > Look Good Feel Better  1st Wednesday of the month 10am-12 noon, Journey Room (Call [place] to register [phone])

## 2016-07-11 NOTE — Telephone Encounter (Signed)
-----   Message from Twana First, MD sent at 07/11/2016  1:14 PM EST ----- Lets get an xray of her right shoulder.  ----- Message ----- From: Darlina Guys, LPN Sent: 01/16/262   1:35 PM To: Twana First, MD  Patient complains of right shoulder pain and rates as high as 10/10 at times. She takes her pain medication every 4 hours. She states the pain started when she started having the thoracentesis and has worsened since she had the Pleurex cath placed. You made note of this complaint of pain in your last note. Please advise what to tell her.  Thanks,  Jaynie Collins

## 2016-07-12 LAB — CULTURE, BODY FLUID W GRAM STAIN -BOTTLE: Culture: NO GROWTH

## 2016-07-12 LAB — CULTURE, BODY FLUID-BOTTLE

## 2016-07-14 ENCOUNTER — Telehealth (HOSPITAL_COMMUNITY): Payer: Self-pay

## 2016-07-14 DIAGNOSIS — C3491 Malignant neoplasm of unspecified part of right bronchus or lung: Secondary | ICD-10-CM

## 2016-07-14 DIAGNOSIS — R112 Nausea with vomiting, unspecified: Secondary | ICD-10-CM

## 2016-07-14 MED ORDER — LORAZEPAM 0.5 MG PO TABS: 0.5000 mg | ORAL_TABLET | Freq: Three times a day (TID) | ORAL | 0 refills | Status: AC

## 2016-07-14 MED ORDER — PROMETHAZINE HCL 25 MG RE SUPP: 25.0000 mg | Freq: Four times a day (QID) | RECTAL | 0 refills | Status: AC | PRN

## 2016-07-14 NOTE — Telephone Encounter (Signed)
Received call from [facility] with [place] home health that patient had been nauseated and vomiting all weekend. The Zofran and compazine was not offering any relief. Reviewed with MD, prescription for ativan 0.5 1 every 8 hours PRN and phenergan supp. 1 every 6 hours PR PRN nausea. Notified Robin Hernandez and the patient about the new prescriptions. Patient has lab appointment tomorrow at [facility] because she is moving her care back there. Both patient and HHN verbalized understanding.

## 2016-07-28 ENCOUNTER — Ambulatory Visit (HOSPITAL_COMMUNITY): Payer: Medicare Other | Admitting: Adult Health

## 2016-07-28 ENCOUNTER — Ambulatory Visit (HOSPITAL_COMMUNITY): Payer: Medicare Other

## 2016-07-29 ENCOUNTER — Ambulatory Visit (HOSPITAL_COMMUNITY): Payer: Medicare Other

## 2016-07-30 ENCOUNTER — Ambulatory Visit (HOSPITAL_COMMUNITY): Payer: Medicare Other

## 2017-01-10 DEATH — deceased

## 2018-11-18 IMAGING — CT CT ANGIO CHEST
1 of 9 series · 13 of 36 positions shown · IV contrast (isovue)
Comparison: Chest radiograph dated 07/05/2016

CLINICAL DATA: 75-year-old female with elevated D-dimer. History of
lung cancer.

EXAM:
CT ANGIOGRAPHY CHEST WITH CONTRAST
TECHNIQUE: Multidetector CT imaging of the chest was performed using the
standard protocol during bolus administration of intravenous
contrast. Multiplanar CT image reconstructions and MIPs were
obtained to evaluate the vascular anatomy.
CONTRAST:  100 cc Isovue 370

[Series 406: thins pacs · axial · 0.74mm/px · z∈[-533,-256]mm · 13 of 325 slices shown]
[im 24/325  lung]
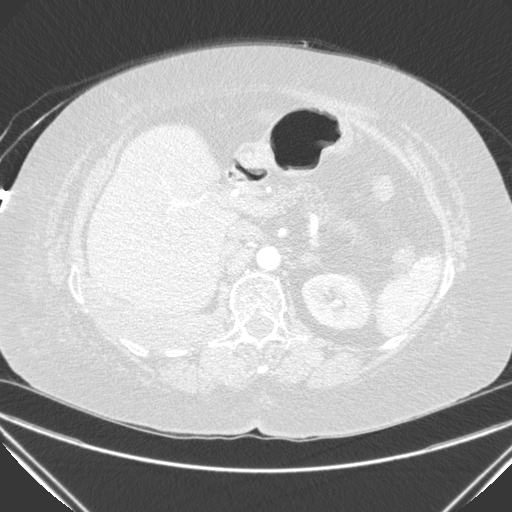
[im 47/325  mediastinal]
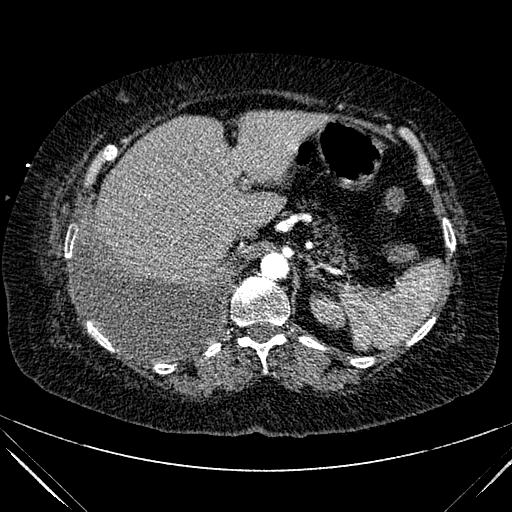
[im 70/325  lung]
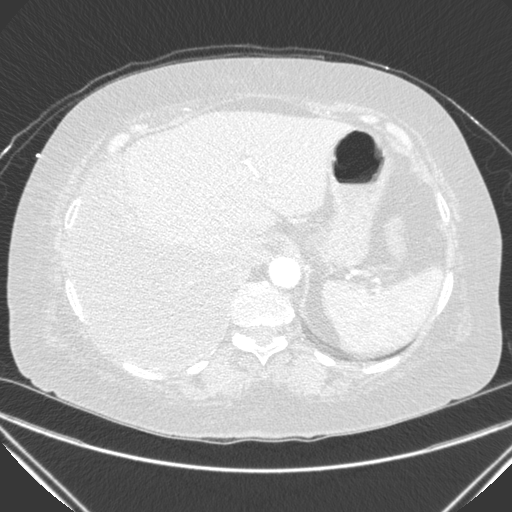
[im 93/325  mediastinal]
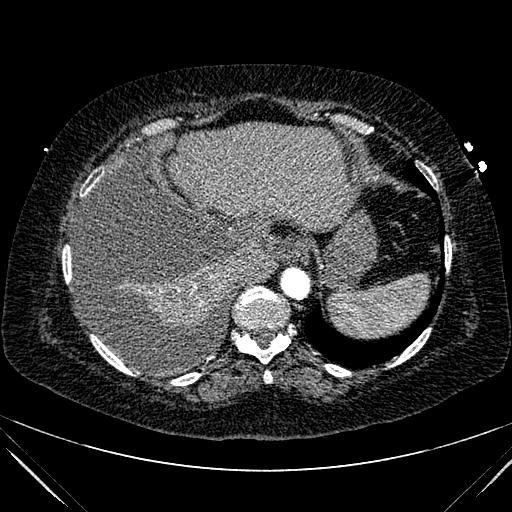
[im 116/325  lung]
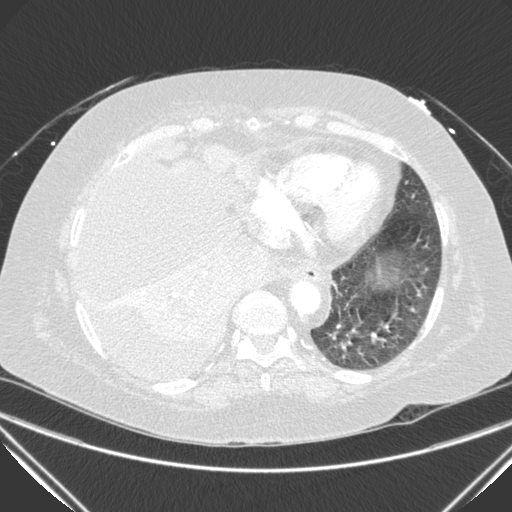
[im 139/325  mediastinal]
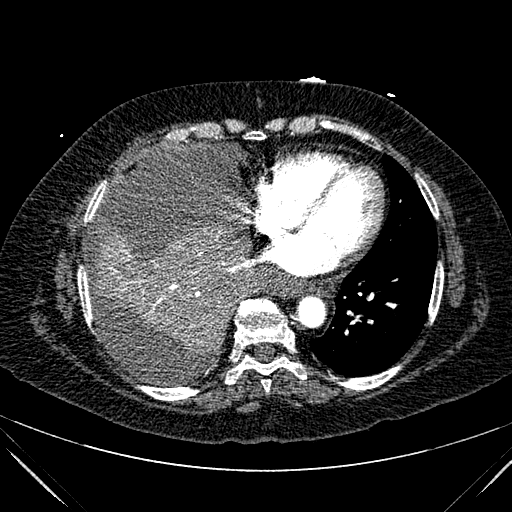
[im 163/325  lung]
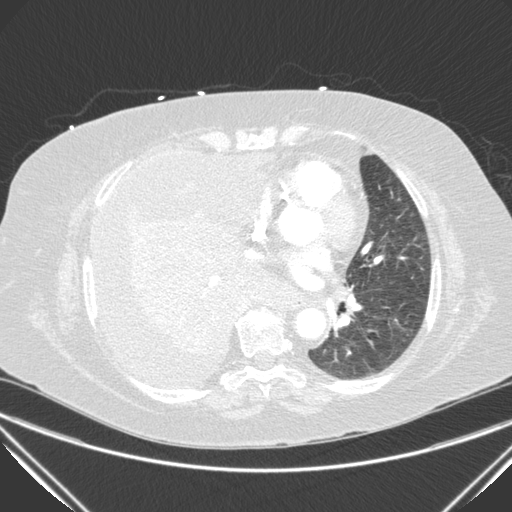
[im 186/325  mediastinal]
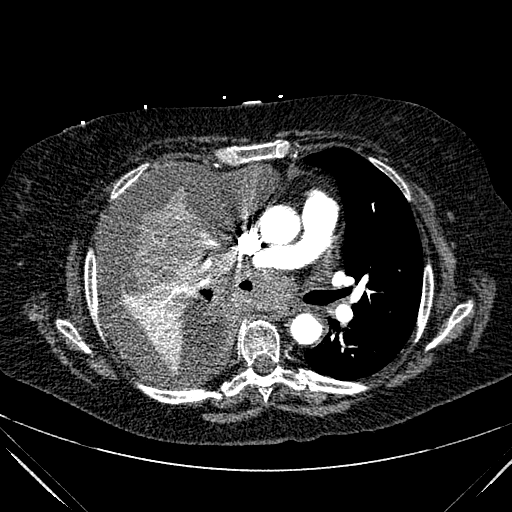
[im 209/325  lung]
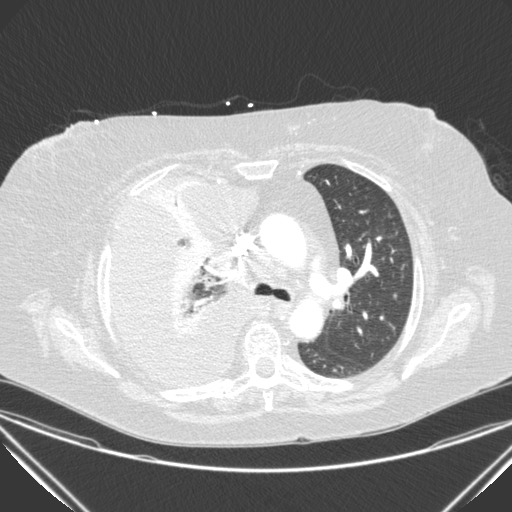
[im 232/325  mediastinal]
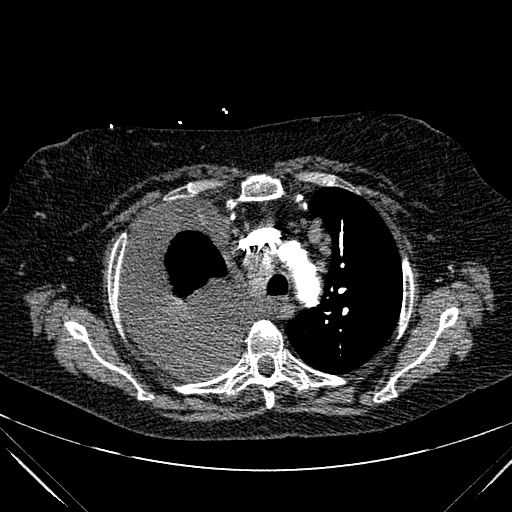
[im 255/325  lung]
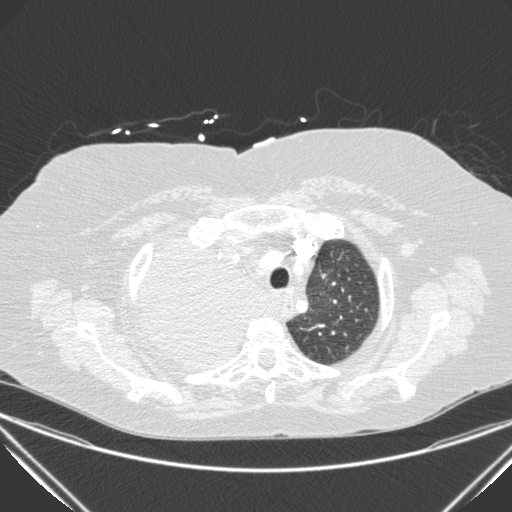
[im 278/325  mediastinal]
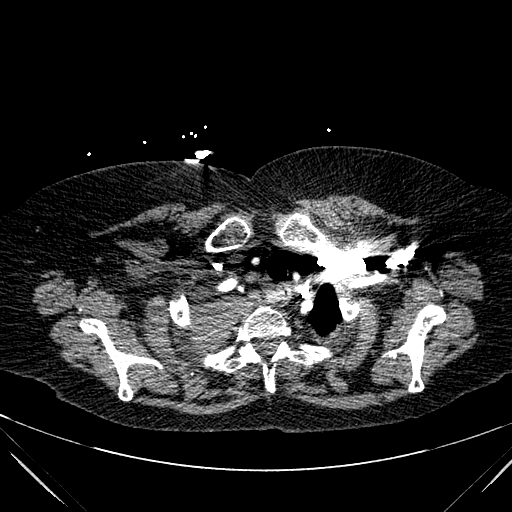
[im 301/325  lung]
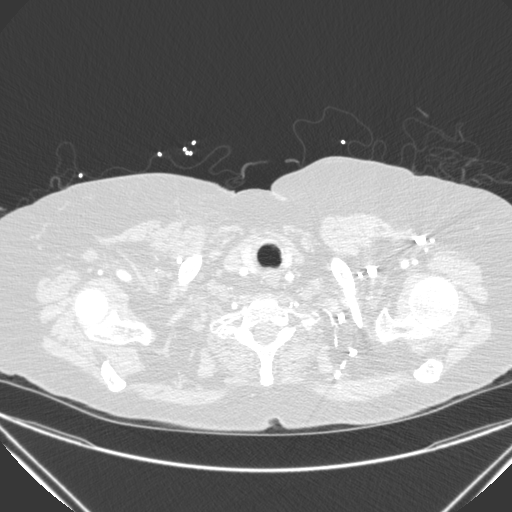

[13 of 36 positions shown; findings below may reference images not displayed]

FINDINGS: Cardiovascular: There is no cardiomegaly or pericardial effusion.
Multi vessel coronary vascular calcification predominantly involving
the LAD. There is mild atherosclerotic calcification of the thoracic
aorta. No aneurysmal dilatation or evidence of dissection. The
origins of the great vessels of the aortic arch appear patent.
Evaluation of the pulmonary arteries is limited due to suboptimal
visualization of the peripheral branches and respiratory motion
artifact. No central pulmonary artery embolus identified.

Mediastinum/Nodes: There is a 3.5 x 3.6 cm subcarinal
mass/adenopathy. This mass encases the bronchus intermedius with
complete occlusion of the distal portion of the right lower lobe
bronchus. There is mild compression of the right upper lobe
bronchus. The left bronchi are patent. There is right paratracheal
adenopathy measuring up to 16 mm in short axis as well as anterior
mediastinal and prevascular adenopathy. Mildly enlarged left hilar
lymph nodes measure up to 11 mm short axis. There is mass effect and
mild displacement of the mediastinum to the left by the right
pleural effusion. Multiple enlarged lymph node noted at the right
cardiophrenic angle measuring up to 19 mm in short axis.

Lungs/Pleura: There is only a small aerated portion of the lung in
the right upper lobe. There is complete consolidative changes of the
right lower lobe. The left lung is clear. There is a large right
pleural effusion most consistent with malignant ascites. Areas of
nodular thickening involving the right pleural consistent with
pleural implants.

Upper Abdomen: No acute abnormality.

Musculoskeletal: Right supraclavicular adenopathy measures 15 mm in
short axis. Mildly enlarged left axillary lymph node measures
approximately 10 mm in short axis. There is osteopenia with mild
degenerative changes of spine. No acute osseous pathology.
Heterogeneity of the posterior aspect of the right second rib may be
chronic or represent metastatic disease.

Review of the MIP images confirms the above findings.
IMPRESSION: 1. No CT evidence of central pulmonary artery embolus.
2. Subcarinal mass/adenopathy with encasement of the right bronchus
intermedius and complete occlusion of the right lower lobe bronchus.
3. Large right pleural effusion most consistent with malignant
ascites. Areas of nodular thickening along the right pleura
compatible with pleural-based metastatic implant.
4. Near complete consolidative changes of the right lung with only a
small aerated portion of the lung in the right upper lobe. The left
lung remains clear.
5. Mediastinal, right cardiophrenic, and right supraclavicular
adenopathy. Mildly enlarged rounded lymph node in the left axilla.
6. Area of heterogeneity in the posterior aspect of the right second
rib may be chronic or represent metastatic disease. This can be
further evaluated with bone scan if clinically indicated.
